# Patient Record
Sex: Female | Born: 1935 | Race: White | Hispanic: No | Smoking: Never smoker
Health system: Southern US, Community
[De-identification: ages and names within clinical notes are randomized; demographics above are authoritative.]

## PROBLEM LIST (undated history)

## (undated) DIAGNOSIS — E0590 Thyrotoxicosis, unspecified without thyrotoxic crisis or storm: Secondary | ICD-10-CM

## (undated) DIAGNOSIS — J8410 Pulmonary fibrosis, unspecified: Secondary | ICD-10-CM

## (undated) DIAGNOSIS — I4891 Unspecified atrial fibrillation: Secondary | ICD-10-CM

## (undated) DIAGNOSIS — D649 Anemia, unspecified: Secondary | ICD-10-CM

## (undated) DIAGNOSIS — E785 Hyperlipidemia, unspecified: Secondary | ICD-10-CM

## (undated) DIAGNOSIS — I10 Essential (primary) hypertension: Secondary | ICD-10-CM

## (undated) HISTORY — PX: NO PAST SURGERIES: SHX2092

---

## 2004-09-28 ENCOUNTER — Inpatient Hospital Stay: Payer: Self-pay | Admitting: Internal Medicine

## 2005-08-16 ENCOUNTER — Ambulatory Visit: Payer: Self-pay | Admitting: Internal Medicine

## 2010-12-06 ENCOUNTER — Inpatient Hospital Stay: Payer: Self-pay | Admitting: Internal Medicine

## 2011-04-25 ENCOUNTER — Inpatient Hospital Stay: Payer: Self-pay | Admitting: Internal Medicine

## 2012-02-13 LAB — URINALYSIS, COMPLETE
Glucose,UR: NEGATIVE mg/dL (ref 0–75)
Ketone: NEGATIVE
Nitrite: POSITIVE
WBC UR: 6 /HPF (ref 0–5)

## 2012-02-13 LAB — CBC
HCT: 40.5 % (ref 35.0–47.0)
MCH: 26.2 pg (ref 26.0–34.0)
MCHC: 31.6 g/dL — ABNORMAL LOW (ref 32.0–36.0)
MCV: 83 fL (ref 80–100)
Platelet: 226 10*3/uL (ref 150–440)
RBC: 4.88 10*6/uL (ref 3.80–5.20)
RDW: 15.5 % — ABNORMAL HIGH (ref 11.5–14.5)

## 2012-02-13 LAB — COMPREHENSIVE METABOLIC PANEL
Albumin: 3.7 g/dL (ref 3.4–5.0)
BUN: 14 mg/dL (ref 7–18)
Calcium, Total: 8.9 mg/dL (ref 8.5–10.1)
EGFR (African American): >60
Osmolality: 280 (ref 275–301)
Potassium: 4.1 mmol/L (ref 3.5–5.1)
SGPT (ALT): 18 U/L
Sodium: 140 mmol/L (ref 136–145)
Total Protein: 7.4 g/dL (ref 6.4–8.2)

## 2012-02-13 LAB — CK TOTAL AND CKMB (NOT AT ARMC)
CK, Total: 45 U/L (ref 21–215)
CK-MB: 0.8 ng/mL (ref 0.5–3.6)

## 2012-02-13 LAB — TROPONIN I: Troponin-I: <0.02 ng/mL

## 2012-02-14 ENCOUNTER — Inpatient Hospital Stay: Payer: Self-pay | Admitting: Internal Medicine

## 2012-02-14 LAB — CK TOTAL AND CKMB (NOT AT ARMC)
CK, Total: 64 U/L (ref 21–215)
CK-MB: 0.7 ng/mL (ref 0.5–3.6)

## 2012-02-14 LAB — PROTIME-INR
INR: 1.7
Prothrombin Time: 20.7 secs — ABNORMAL HIGH (ref 11.5–14.7)

## 2012-02-14 LAB — PRO B NATRIURETIC PEPTIDE: B-Type Natriuretic Peptide: 1054 pg/mL — ABNORMAL HIGH (ref 0–450)

## 2012-02-16 LAB — URINE CULTURE

## 2012-02-21 ENCOUNTER — Ambulatory Visit: Payer: Self-pay | Admitting: Family Medicine

## 2012-03-04 ENCOUNTER — Ambulatory Visit: Payer: Self-pay | Admitting: Family Medicine

## 2012-03-04 LAB — CREATININE, SERUM
Creatinine: 0.75 mg/dL (ref 0.60–1.30)
EGFR (African American): >60

## 2013-10-02 ENCOUNTER — Emergency Department: Payer: Self-pay | Admitting: Emergency Medicine

## 2013-10-02 LAB — COMPREHENSIVE METABOLIC PANEL
ALBUMIN: 3.5 g/dL (ref 3.4–5.0)
ANION GAP: 8 (ref 7–16)
Alkaline Phosphatase: 94 U/L
BUN: 11 mg/dL (ref 7–18)
Bilirubin,Total: 0.6 mg/dL (ref 0.2–1.0)
CHLORIDE: 106 mmol/L (ref 98–107)
Calcium, Total: 9.4 mg/dL (ref 8.5–10.1)
Co2: 23 mmol/L (ref 21–32)
Creatinine: 0.61 mg/dL (ref 0.60–1.30)
EGFR (African American): >60
GLUCOSE: 99 mg/dL (ref 65–99)
Osmolality: 273 (ref 275–301)
Potassium: 4 mmol/L (ref 3.5–5.1)
SGOT(AST): 29 U/L (ref 15–37)
SGPT (ALT): 16 U/L (ref 12–78)
SODIUM: 137 mmol/L (ref 136–145)
Total Protein: 7.9 g/dL (ref 6.4–8.2)

## 2013-10-02 LAB — CBC WITH DIFFERENTIAL/PLATELET
BASOS ABS: 0 10*3/uL (ref 0.0–0.1)
Basophil %: 0.2 %
EOS ABS: 0 10*3/uL (ref 0.0–0.7)
Eosinophil %: 0.2 %
HCT: 43.3 % (ref 35.0–47.0)
HGB: 13.9 g/dL (ref 12.0–16.0)
LYMPHS PCT: 9 %
Lymphocyte #: 1.5 10*3/uL (ref 1.0–3.6)
MCH: 26.4 pg (ref 26.0–34.0)
MCHC: 32.2 g/dL (ref 32.0–36.0)
MCV: 82 fL (ref 80–100)
Monocyte #: 1.2 x10 3/mm — ABNORMAL HIGH (ref 0.2–0.9)
Monocyte %: 7.3 %
NEUTROS ABS: 14.1 10*3/uL — AB (ref 1.4–6.5)
Neutrophil %: 83.3 %
Platelet: 252 10*3/uL (ref 150–440)
RBC: 5.28 10*6/uL — AB (ref 3.80–5.20)
RDW: 15.2 % — ABNORMAL HIGH (ref 11.5–14.5)
WBC: 17 10*3/uL — AB (ref 3.6–11.0)

## 2013-10-02 LAB — URINALYSIS, COMPLETE
Bilirubin,UR: NEGATIVE
GLUCOSE, UR: NEGATIVE mg/dL (ref 0–75)
NITRITE: POSITIVE
Ph: 5 (ref 4.5–8.0)
Protein: 30
Specific Gravity: 1.016 (ref 1.003–1.030)
WBC UR: 6 /HPF (ref 0–5)

## 2013-10-02 LAB — LIPASE, BLOOD: Lipase: 86 U/L (ref 73–393)

## 2013-10-08 ENCOUNTER — Ambulatory Visit: Payer: Self-pay | Admitting: Surgery

## 2013-10-08 LAB — COMPREHENSIVE METABOLIC PANEL
ALK PHOS: 84 U/L
ALT: 19 U/L (ref 12–78)
ANION GAP: 11 (ref 7–16)
Albumin: 3.3 g/dL — ABNORMAL LOW (ref 3.4–5.0)
BILIRUBIN TOTAL: 0.3 mg/dL (ref 0.2–1.0)
BUN: 13 mg/dL (ref 7–18)
Calcium, Total: 9.2 mg/dL (ref 8.5–10.1)
Chloride: 104 mmol/L (ref 98–107)
Co2: 26 mmol/L (ref 21–32)
Creatinine: 0.91 mg/dL (ref 0.60–1.30)
EGFR (African American): >60
GFR CALC NON AF AMER: 56 — AB
GLUCOSE: 103 mg/dL — AB (ref 65–99)
Osmolality: 282 (ref 275–301)
Potassium: 4.1 mmol/L (ref 3.5–5.1)
SGOT(AST): 16 U/L (ref 15–37)
SODIUM: 141 mmol/L (ref 136–145)
Total Protein: 7.3 g/dL (ref 6.4–8.2)

## 2013-10-08 LAB — CBC WITH DIFFERENTIAL/PLATELET
BASOS ABS: 0.1 10*3/uL (ref 0.0–0.1)
Basophil %: 0.8 %
EOS ABS: 0.1 10*3/uL (ref 0.0–0.7)
Eosinophil %: 1.5 %
HCT: 41 % (ref 35.0–47.0)
HGB: 13.1 g/dL (ref 12.0–16.0)
Lymphocyte #: 2 10*3/uL (ref 1.0–3.6)
Lymphocyte %: 19.9 %
MCH: 26.3 pg (ref 26.0–34.0)
MCHC: 32 g/dL (ref 32.0–36.0)
MCV: 82 fL (ref 80–100)
MONO ABS: 0.9 x10 3/mm (ref 0.2–0.9)
MONOS PCT: 9.6 %
Neutrophil #: 6.7 10*3/uL — ABNORMAL HIGH (ref 1.4–6.5)
Neutrophil %: 68.2 %
PLATELETS: 292 10*3/uL (ref 150–440)
RBC: 4.98 10*6/uL (ref 3.80–5.20)
RDW: 15.2 % — ABNORMAL HIGH (ref 11.5–14.5)
WBC: 9.9 10*3/uL (ref 3.6–11.0)

## 2013-10-19 LAB — COMPREHENSIVE METABOLIC PANEL
ALBUMIN: 2.9 g/dL — AB (ref 3.4–5.0)
ALK PHOS: 105 U/L
ALT: 20 U/L (ref 12–78)
Anion Gap: 4 — ABNORMAL LOW (ref 7–16)
BUN: 17 mg/dL (ref 7–18)
Bilirubin,Total: 0.6 mg/dL (ref 0.2–1.0)
Calcium, Total: 9.1 mg/dL (ref 8.5–10.1)
Chloride: 105 mmol/L (ref 98–107)
Co2: 25 mmol/L (ref 21–32)
Creatinine: 0.75 mg/dL (ref 0.60–1.30)
EGFR (Non-African Amer.): >60
Glucose: 118 mg/dL — ABNORMAL HIGH (ref 65–99)
Osmolality: 271 (ref 275–301)
POTASSIUM: 4 mmol/L (ref 3.5–5.1)
SGOT(AST): 33 U/L (ref 15–37)
SODIUM: 134 mmol/L — AB (ref 136–145)
Total Protein: 7.5 g/dL (ref 6.4–8.2)

## 2013-10-19 LAB — URINALYSIS, COMPLETE
BILIRUBIN, UR: NEGATIVE
GLUCOSE, UR: NEGATIVE mg/dL (ref 0–75)
LEUKOCYTE ESTERASE: NEGATIVE
Nitrite: NEGATIVE
PH: 5 (ref 4.5–8.0)
Protein: 30
SPECIFIC GRAVITY: 1.024 (ref 1.003–1.030)

## 2013-10-19 LAB — CBC
HCT: 41.1 % (ref 35.0–47.0)
HGB: 12.9 g/dL (ref 12.0–16.0)
MCH: 25.6 pg — AB (ref 26.0–34.0)
MCHC: 31.3 g/dL — AB (ref 32.0–36.0)
MCV: 82 fL (ref 80–100)
PLATELETS: 240 10*3/uL (ref 150–440)
RBC: 5.02 10*6/uL (ref 3.80–5.20)
RDW: 14.8 % — ABNORMAL HIGH (ref 11.5–14.5)
WBC: 17.7 10*3/uL — ABNORMAL HIGH (ref 3.6–11.0)

## 2013-10-19 LAB — PROTIME-INR
INR: 4.2 — AB
PROTHROMBIN TIME: 38.9 s — AB (ref 11.5–14.7)

## 2013-10-19 LAB — TROPONIN I

## 2013-10-20 ENCOUNTER — Inpatient Hospital Stay: Payer: Self-pay | Admitting: Internal Medicine

## 2013-10-21 LAB — BASIC METABOLIC PANEL
Anion Gap: 4 — ABNORMAL LOW (ref 7–16)
BUN: 14 mg/dL (ref 7–18)
CALCIUM: 8.7 mg/dL (ref 8.5–10.1)
CHLORIDE: 110 mmol/L — AB (ref 98–107)
Co2: 25 mmol/L (ref 21–32)
Creatinine: 0.86 mg/dL (ref 0.60–1.30)
EGFR (African American): >60
EGFR (Non-African Amer.): >60
Glucose: 96 mg/dL (ref 65–99)
Osmolality: 278 (ref 275–301)
POTASSIUM: 3.8 mmol/L (ref 3.5–5.1)
Sodium: 139 mmol/L (ref 136–145)

## 2013-10-21 LAB — CBC WITH DIFFERENTIAL/PLATELET
BASOS ABS: 0 10*3/uL (ref 0.0–0.1)
BASOS PCT: 0.3 %
Eosinophil #: 0.1 10*3/uL (ref 0.0–0.7)
Eosinophil %: 1.1 %
HCT: 34.4 % — AB (ref 35.0–47.0)
HGB: 11.5 g/dL — AB (ref 12.0–16.0)
LYMPHS PCT: 17.4 %
Lymphocyte #: 1.7 10*3/uL (ref 1.0–3.6)
MCH: 27.1 pg (ref 26.0–34.0)
MCHC: 33.4 g/dL (ref 32.0–36.0)
MCV: 81 fL (ref 80–100)
MONOS PCT: 7.8 %
Monocyte #: 0.7 x10 3/mm (ref 0.2–0.9)
NEUTROS ABS: 7 10*3/uL — AB (ref 1.4–6.5)
Neutrophil %: 73.4 %
PLATELETS: 171 10*3/uL (ref 150–440)
RBC: 4.24 10*6/uL (ref 3.80–5.20)
RDW: 14.7 % — ABNORMAL HIGH (ref 11.5–14.5)
WBC: 9.6 10*3/uL (ref 3.6–11.0)

## 2013-10-21 LAB — PROTIME-INR
INR: 4.3
Prothrombin Time: 40.1 secs — ABNORMAL HIGH (ref 11.5–14.7)

## 2013-10-22 LAB — PROTIME-INR
INR: 2.7
Prothrombin Time: 28.2 secs — ABNORMAL HIGH (ref 11.5–14.7)

## 2013-10-23 LAB — PROTIME-INR
INR: 3.3
Prothrombin Time: 32.5 secs — ABNORMAL HIGH (ref 11.5–14.7)

## 2013-10-25 LAB — CULTURE, BLOOD (SINGLE)

## 2015-01-02 NOTE — Discharge Summary (Signed)
PATIENT NAME:  Jillian Russell, Jillian Russell MR#:  [id number] DATE OF BIRTH:  [birth year]  DATE OF ADMISSION:  10/20/2013 DATE OF DISCHARGE:  10/23/2013  DISPOSITION: To skilled nursing.  DISCHARGE DIAGNOSES: 1. Altered mental status secondary to systemic inflammatory response syndrome from  pneumonia.  2. Low back pain.  3. Chronic atrial fibrillation.  4. Hypertension.  5. Mild acute delirium resolved.   DISCHARGE MEDICATIONS: 1. Metoprolol 25 mg p.o. b.i.d.  2. Coumadin 3 mg 4 times a week and 4 mg the rest of the time. The patient takes 4 mg 3 times a week and 3 mg on alternate days.  3. Cartia XT 120 mg p.o. daily.  4. Tramadol for back pain 50 mg every 4 hours p.r.n. for pain.  5. Acetaminophen as needed for pain, that is 325 mg every 6 hours.  6. Lidoderm patch 5%  topically to affected area once a day.  7. Levaquin 500 mg every other day,  5 tablets are given.   HOSPITAL CONSULTATIONS: Orthopedic consult with Dr. Bethann PunchesMark Miller.   HOSPITAL COURSE:  1. This is an 81 year old female patient with altered mental status and SIRS, found to have pneumonia and started on antibiotics. Look at the history and physical for full details. The patient brought in because of generalized weakness and poor p.o. intake for two weeks. The patient was tried on Keflex and antibiotics for UTI and the patient was found to have pneumonia, started on Levaquin. Blood cultures have been negative. Initial white count was 17 and then dropped to 9 on the 10th. The patient's blood cultures negative. The patient improved nicely with antibiotic and mental status improved. CT head did not show any acute changes. The patient will be on Levaquin for a few more days to complete the course. She has been afebrile and not hypoxic, 94% on 2 liters.  2. Chronic back pain seen by Dr. Bethann PunchesMark Miller. The patient is on K pad and lidocaine patch and also Tramadol as needed. The patient got physical therapy and the patient referred to Dr. Hyacinth MeekerMiller as  an outpatient for compression fracture. She does have chronic back pain, but the patient is tolerating physical therapy. They recommended rehab. The patient right now is on Lidoderm patch and tramadol, and walking. Today she walked with physical therapy.  The patient is referred to rehAB>  for short-term rehab by physical therapy.  3. Chronic atrial fibrillation. She is on Coumadin and INR was 4.2 when she came. The patient's Coumadin was held because of INR and repeat INR levels came back at 2.7 on the 11th of February. Restarted on Coumadin. The patient can continue with the Coumadin as I described in instructions.   TIME SPENT ON DISCHARGE PREPARATION: More than 30 minutes and the patient will be going to rehab when the bed is available. Discussed this plan with the patient's son.   ____________________________ Katha HammingSnehalatha Tyre Beaver, MD sk:sg D: 10/23/2013 13:49:22 ET T: 10/23/2013 16:49:55 ET JOB#: [id number]  cc: Katha HammingSnehalatha Pranshu Lyster, MD, <Dictator> Katha HammingSNEHALATHA Tegan Burnside MD ELECTRONICALLY SIGNED 10/30/2013 21:56

## 2015-01-02 NOTE — H&P (Signed)
PATIENT NAME:  Jillian Russell, MONGIELLO MR#:  [id number] DATE OF BIRTH:  1924-02-22  DATE OF ADMISSION:  10/20/2013  PRIMARY CARE PHYSICIAN:  Dr. Maryjane Hurter.  REFERRING PHYSICIAN:  Dr. Cyril Loosen.  CHIEF COMPLAINT: Generalized weakness and difficulty walking.   HISTORY OF PRESENT ILLNESS: The patient is an 81 year old Caucasian female who is  brought into the ER by her son for generalized weakness. Son is concerned that she is not eating well for the past 2 weeks or so, and then lately she is just lying in the bed feeling weak and tired and not moving much. The patient was evaluated by ER physician approximately 2 weeks ago and diagnosed with urinary tract infection and gallstones. At the time, she was placed on antibiotics and she was discharged home. The patient followed with Dr. Excell Seltzer, surgeon, regarding gallstones, and she was diagnosed with compression fractures of the L-spine. Dr. Excell Seltzer has asked the patient to complete her antibiotics and follow up with her primary care physician regarding the compression fractures. Primary care physician has referred her to [facility] and they have scheduled an appointment on February 18th.  The patient was given pain medication of Ultram to help with her abdominal pain and back pain. According to the son, though she has not taken any pain medication, she has been lethargic and not eating much for the past 4 or 5 days. She has been sleeping all the time, which is unusual. The patient is brought into the ER. Her CAT scan of the head and has revealed no acute findings. CT of the chest has revealed right upper lobe and middle lobe pneumonia. The patient was given IV antibiotics, and hospitalist team is called to admit the patient. During my examination, the patient is lethargic but answering questions. She said her pain is manageable. Denies any cough or chest pain. No other complaints. The patient is on Coumadin for chronic atrial fibrillation.   PAST MEDICAL  HISTORY: Acute right postcentral gyrus CVA in 2012, chronic atrial fibrillation, on Coumadin; hypertension, multinodular goiter, interstitial pulmonary fibrosis, iron deficiency anemia, cataracts.   PAST SURGICAL HISTORY: None.   ALLERGIES: No known drug allergies.   PSYCHOSOCIAL HISTORY: Lives at home with son. No history of smoking, alcohol or illicit drug usage.   FAMILY HISTORY: Father had leukemia.   HOME MEDICATIONS: Coumadin 4 mg 3 times a week and 3 mg on alternate days, 4 times in a week; metoprolol 20 mg p.o. 2 times a day, Keflex 500 mg 3 times a day, Cartia 120 mg extended release 1 capsule p.o. once daily.   REVIEW OF SYSTEMS: CONSTITUTIONAL: Denies any pain during my examination. Complaining of fever, fatigue, weakness, weight loss of 10 pounds in the past 1 month.  EYES: Denies blurry vision, double vision.  ENT: Denies epistaxis, discharge.  RESPIRATORY: Denies wheezing, hemoptysis. No COPD.  CARDIOVASCULAR: No chest pain, palpitations.   GASTROINTESTINAL: Denies nausea, vomiting, diarrhea. Not complaining of any abdominal pain.  GENITOURINARY: No dysuria or hematuria.  GYNECOLOGIC AND BREASTS: Denies breast mass or vaginal discharge.  ENDOCRINE: Denies polyuria or nocturia. Has multinodular goiter.  HEMATOLOGIC AND LYMPHATIC: No anemia, easy bruising or bleeding.  INTEGUMENTARY: No acne, rash or lesions.  MUSCULOSKELETAL: Complaining of low back pain. Denies gout.  NEUROLOGIC: No vertigo or ataxia.  PSYCHIATRIC: No ADD or OCD.   PHYSICAL EXAMINATION: VITAL SIGNS: Temperature 97.8, pulse 107, respirations 20. Blood pressure is 153/74. Pulse ox 96%.  GENERAL APPEARANCE: Not in any acute distress. The patient is lethargic,  looks pale. HEENT: Normocephalic, atraumatic. Pupils are equal, reacting light and accommodation. No scleral icterus. No conjunctival injection. No sinus tenderness. Positive postnasal drip.  NECK: Supple. No JVD. No thyromegaly. Range of motion is  intact.  LUNGS: Breath sounds on the right side was with positive crackles. Moderate air entry.  CARDIOVASCULAR: Irregularly irregular.  GASTROINTESTINAL: Soft. Bowel sounds are positive in all 4 quadrants. Nontender, nondistended. No hepatosplenomegaly. No masses felt. No CVA tenderness.  NEUROLOGIC:  Lethargic but answers questions appropriately, oriented x 3. Cranial nerves II through XII are intact. Following verbal commands. Motor and sensory are intact. Reflexes are 2+.  EXTREMITIES: No edema, cyanosis. No clubbing.  SKIN: Warm to touch. Normal turgor. No rashes. No lesions.  MUSCULOSKELETAL: No joint effusion, tenderness, erythema.  PSYCHIATRIC: Flat mood and affect.   LABORATORY DATA AND IMAGING STUDIES: CT of the head without contrast has revealed atrophy and chronic ischemic white matter disease without acute intracranial abnormality. CT of the chest without contrast: Right upper and right lower lobe pneumonia. LFTs:  Albumin is low at 2.9. The rest of the LFTs are normal. Troponin less than 0.02. WBC 17.7, hemoglobin 12.9, hematocrit 41.1, platelets 240, MCV 82. PT 38.9, INR 4.2. Urinalysis: Yellow in color, hazy in appearance. Glucose and bilirubin are negative.  Nitrites and leukocyte are negative. Chem-8: Glucose 118, BUN 17, creatinine 0.75, sodium 134, potassium 4.0. GFR greater than 60. Anion gap 4. Serum osmolality and calcium are normal.   ASSESSMENT AND PLAN: An 81 year old female presenting with generalized weakness, decreased p.o. intake. Will be admitted with the following assessment and plan:  1.  Altered mental status secondary to systemic inflammatory response syndrome from right-sided pneumonia. Blood cultures and sputum cultures are ordered. IV levofloxacin. As the patient recently used antibiotics for urinary tract infection, we will add Zosyn.  2.  Failure to thrive with 10 pounds of weight loss in 1 month, probably from ongoing series of sickness. We will put a dietitian  consult. We will get calorie count.  CT head and CT chest are normal. Further management by the rounding physician and outpatient primary care physician.   3.  Chronic atrial fibrillation on Coumadin, currently with coagulopathy. We will hold off on the Coumadin and check PT/INR. Pharmacy is consulted regarding Coumadin management.  4.  Hypertension. Blood pressure is stable. We will continue her home medication.  5.  History of stroke in the past.  6.  We will provide gastrointestinal and deep vein thrombosis prophylaxis.   She is FULL CODE. Son is the medical power of attorney. Diagnosis and plan of care was discussed in detail with the patient and her son at bedside. They verbalized understanding of the plan.   Total time spent on admission is 45 minutes.   ____________________________ Ramonita LabAruna Jajaira Ruis, MD ag:dmm D: 10/20/2013 01:04:23 ET T: 10/20/2013 09:34:37 ET JOB#: [id number]  cc: Ramonita LabAruna Caroleen Stoermer, MD, <Dictator> Marina Goodellale E. Feldpausch, MD Ramonita LabARUNA Butler Vegh MD ELECTRONICALLY SIGNED 11/02/2013 3:21

## 2015-01-02 NOTE — Consult Note (Signed)
Chief Complaint:  Subjective/Chief Complaint Low back pain is some better.  X-rays of lumbosacral spine show chronic compression fractures L2 and L4.  Present on X-rays in January also when pain was thoracic. osteoarthritis present.  straight leg raise negative.  circulation/sensation/motor function good.   VITAL SIGNS/ANCILLARY NOTES: **Vital Signs.:   11-Feb-15 05:23  Vital Signs Type Routine  Temperature Temperature (F) 98.4  Celsius 36.8  Temperature Source oral  Pulse Pulse 92  Respirations Respirations 18  Systolic BP Systolic BP 108  Diastolic BP (mmHg) Diastolic BP (mmHg) 73  Mean BP 84  Pulse Ox % Pulse Ox % 95  Pulse Ox Activity Level  At rest  Oxygen Delivery 2L    08:04  Pulse Ox % Pulse Ox % 95  Pulse Ox Activity Level  At rest  Oxygen Delivery 2L; Nasal Cannula   Brief Assessment:  EXTR negative edema   Assessment/Plan:  Assessment/Plan:  Assessment Mechanical low back pain from osteoarthritis and old compression fractures   Plan lumbosacral corset mild analgesics as needed Physical Therapy for ambulation   Electronic Signatures: Valinda HoarMiller, Abbrielle Batts E (MD)  (Signed 11-Feb-15 13:05)  Authored: Chief Complaint, VITAL SIGNS/ANCILLARY NOTES, Brief Assessment, Assessment/Plan   Last Updated: 11-Feb-15 13:05 by Valinda HoarMiller, Berkleigh Beckles E (MD)

## 2015-01-02 NOTE — Consult Note (Signed)
Brief Consult Note: Diagnosis: Low back pain.   Patient was seen by consultant.   Recommend further assessment or treatment.   Orders entered.   Comments: 81 year old female admitted with pneumonia and complaints of low back pain.  Had chest x-ray 1/22 which showed possible T-9 compression fracture. Seen by Dr Noreene FilbertFeldspaugh and [facility] follow[facility] but she has been admitted to [facility] now.  No complaints of thoracic pain.  Points to lower back as area of discomfort. Son present for exam.   Exam:  Alert, lying on side in bed.  Thoracic spine non tender.  Lumbar region sore to percussion. No bruising or swelling.  Hip range of motion good. circulation/sensation/motor function good distally.    X-rays: ordered lumbosacral spine X-rays   Rx:  kpad        Mild analgesics as needed        Will follow-up tomorrow.  Electronic Signatures: Valinda HoarMiller, Keidrick Murty E (MD)  (Signed 09-Feb-15 15:46)  Authored: Brief Consult Note   Last Updated: 09-Feb-15 15:46 by Valinda HoarMiller, Jessice Madill E (MD)

## 2015-01-03 NOTE — H&P (Signed)
PATIENT NAME:  Jillian Russell, Jillian Russell MR#:  [id number] DATE OF BIRTH:  27-Oct-1923  DATE OF ADMISSION:  02/14/2012  REFERRING PHYSICIAN: Dr. Manson PasseyBrown.   PRIMARY PHYSICIAN: Maudie Flakesale Feldpausch, MD [facility]    PRIMARY CARDIOLOGIST: Dr. Gwen PoundsKowalski   PRESENTING COMPLAINT: Pain underneath left breast.   HISTORY OF PRESENT ILLNESS: Jillian Russell is a pleasant 81 year old woman with history of chronic atrial fibrillation, history of CVA, hypertension, multinodular goiter, interstitial pulmonary fibrosis, and iron deficiency anemia who presents with reports of developing acute onset of pain underneath her left breast 1 to 2 days ago. Reports that she waited to be seen by Dr. Gwen PoundsKowalski but her symptoms worsened and decided to come in for evaluation. Describes the pain as sharp, no radiation. She reports shortness of breath with the pain. It is intermittent at best but mainly when she moves but reports no pain with laying still. Denies any nausea or vomiting. No cough or fevers. No sick contact. No diaphoresis. Denies any lower extremity swelling. Currently her pain has improved.   PAST MEDICAL HISTORY:  1. Admitted 08/14 to 04/26/2011 with acute right post central gyrus CVA.  2. Chronic atrial fibrillation, on Coumadin.  3. Hypertension.  4. Multinodular goiter.  5. Interstitial pulmonary fibrosis.  6. Iron deficiency anemia.  7. Cataracts.   PAST SURGICAL HISTORY: None.   ALLERGIES: No known drug allergies.     MEDICATIONS:  1. Coumadin 4 mg on Monday, Wednesdays, and Fridays; 3 mg on Tuesdays, Thursdays, and Sundays.  2. Metoprolol 25 mg b.i.d.  3. Cardizem CD 240 mg daily.   SOCIAL HISTORY: No tobacco, alcohol, or drug use. She lives with her son.   FAMILY HISTORY: Father had leukemia.   REVIEW OF SYSTEMS: CONSTITUTIONAL: No fevers, chills. EYES: She has history of cataracts. ENT: No epistaxis or discharge. RESPIRATORY: As per history of present illness. CARDIOVASCULAR: As per history of  present illness. Denies any edema, palpitations, or syncope. GI: No nausea or vomiting. She reports intermittent diarrhea and constipation. Denies any abdominal pain. No hematemesis or melena. GU: No dysuria or hematuria. ENDOCRINE: No polyuria or polydipsia. HEME: No bleeding. SKIN: No ulcers. MUSCULOSKELETAL: No pain or swelling. NEUROLOGIC: She has history of stroke. PSYCH: Denies any suicidal ideation.   PHYSICAL EXAMINATION:   VITAL SIGNS: Temperature 97, pulse 72, respiratory rate 18, blood pressure 136/77, sating currently mid 90's on 2 liters of oxygen. She had transient drop of her oxygen to 88% on room air per ED physician.   GENERAL: Lying in bed in no apparent distress.   HEENT: Normocephalic, atraumatic. Pupils equal, symmetric, nonicteric. Nasal cannula in place. Moist mucous membrane.   NECK: Soft and supple. No adenopathy. She has slightly elevated JVP.  CARDIOVASCULAR: Irregular, non-tachy. Soft systolic murmur. No rubs or gallops.   LUNGS: Faint expiratory wheezing. Basilar crackles. No use of accessory muscles or increased respiratory effort.   ABDOMEN: Soft. Positive bowel sounds. No mass appreciated.   EXTREMITIES: No edema. Dorsal pedis pulses intact.   MUSCULOSKELETAL: No joint effusion.   SKIN: No ulcers.   NEUROLOGIC: No dysarthria or aphasia. Symmetrical strength. No focal deficits.   PSYCH: She is alert and oriented. The patient is cooperative.   PERTINENT LABS AND STUDIES: Lactic acid 0.6. CK 45. MB 0.8. Glucose 104, BUN 14, creatinine 0.68, sodium 140, potassium 4.1, chloride 107, carbon dioxide 23, calcium 8.9. LFTs within normal limits. WBC 11.9, hemoglobin 12.8, hematocrit 40.5, platelets 226, MCV 83. Lipase 88. Troponin less than 0.02. Urinalysis  with specific gravity of 1.017, blood 1+, pH 5.0, protein negative, nitrite positive, leukocyte esterase 1+, RBCs 2 per high-power field, WBCs 6 per high-power field.   EKG with atrial fibrillation, rate of 77.  No ST elevation or depression.   CT of abdomen and pelvis with interlobular septal thickening in the lung bases with interstitial infiltrates. Appearance is suggestive of pulmonary edema. There is low attenuation lesion in the central liver. This lesion is indeterminate. CT follow-up, MRI, or PET CT evaluation could be performed if indicated. There is a small umbilical hernia containing fat. Multiple calcified and noncalcified uterine fibroids. There are multiple peripelvic cysts in each kidney. There is a small duodenal diverticulum and diverticulosis of the sigmoid colon without evidence of diverticulitis.   ASSESSMENT AND PLAN: Jillian Russell is an 81 year old woman with history of chronic atrial fibrillation on Coumadin, CVA, multinodular goiter, hypertension, interstitial fibrosis, and anemia presenting with reports of pain under left breast.  1. Atypical chest pain, CHF versus infection. Had a transient drop in oxygen. CT scan results as dictated above. Could be in the setting of pulmonary edema. She has valvular heart disease and dilated ventricles as per her last echo. Will continue empiric treatment on Levaquin. Continue on oxygen. Her WBC could be related to stress margination and wheezing is likely more cardiac. Will continue tele. Cycle cardiac enzymes. Will get a repeat echocardiogram. Her last echocardiogram from 11/12/2010 with EF of 55%, left atrium severely dilated, right atrium moderately dilated, and moderate to severe MR and TR. Will send a BNP. Start her on IV Lasix. Will continue I's and O's and daily weights. Will obtain chest x-ray post diuresis.  2. Chronic atrial fibrillation and hypertension, rate controlled. Resume her metoprolol and Cardizem. Resume her Coumadin and follow INR especially while on Levaquin.  3. Mild urinary tract infection. Send urine culture. Levaquin as above.  4. History of multinodular goiter. Send TSH. 5. Incidental finding of central lesion on CT scan.  Follow-up scan per PCP.  6. Prophylaxis with Coumadin.   TIME SPENT ON PATIENT CARE: Approximately 50 minutes.    ____________________________ Reuel Derby, MD ap:drc D: 02/14/2012 03:37:46 ET T: 02/14/2012 07:57:33 ET JOB#: [id number]  cc: Reuel Derby, MD, <Dictator> Marina Goodell, MD Reuel Derby MD ELECTRONICALLY SIGNED 02/20/2012 3:23

## 2015-01-03 NOTE — Discharge Summary (Signed)
PATIENT NAME:  Jillian Russell, [name] MR#:  [id number] DATE OF BIRTH:  11-Jul-1924  DATE OF ADMISSION:  02/14/2012 DATE OF DISCHARGE:  02/14/2012  PRESENTING COMPLAINT: Left lower chest wall pain.   DISCHARGE DIAGNOSES:  1. Chest pain, appears atypical.  2. Mild diastolic heart failure.  3. Hypertension.   CONDITION ON DISCHARGE: Fair. Sats 92% on room air.   MEDICATIONS:  1. Metoprolol 25 mg b.i.d.  2. Cardizem CD 240 mg extended release b.i.d.  3. Warfarin 3 mg 4 times a week and 4 mg 3 times a week according to previous schedule.  FOLLOWUP: Follow up with Dr. Gwen PoundsKowalski today at 3:15 p.m.   LABORATORY, DIAGNOSTIC AND RADIOLOGICAL DATA: Cardiac enzymes times three negative. PT-INR 20.7-1.7. Echo Doppler showed ejection fraction of 65%, moderate left ventricular concentric hypertrophy. Left atrium is mildly dilated. Severe mitral regurgitation, moderate tricuspid regurgitation. CT of the abdomen and pelvis with contrast showed sigmoid diverticulosis without evidence of diverticulitis. There is a moderate amount of stool in the ascending and transverse portion of the colon. Uterus is enlarged and contains multiple fibroids. There is a nonspecific 1.5-cm hypodensity in the right hepatic lobe which can be followed by triphasic CT by primary care physician as outpatient. Lactic acid 0.6. Comprehensive metabolic panel within normal limits. CBC within normal limits. Urinalysis: Leukocyte esterase positive, 6  WBCs.  B-type natriuretic peptide 1056.   BRIEF SUMMARY OF HOSPITAL COURSE: Jillian Russell is an 81 year old Caucasian female with history of atrial fibrillation on Coumadin, cerebrovascular accident, multinodular goiter, and interstitial fibrosis who presented with:  1. Atypical chest pain, which is more likely due to with mild congestive heart failure, suspected diastolic. The patient sats were transiently down to 88 in the ER, now improved to 92% to 93% on room air. She received Lasix two doses, diuresed  well, and wheezing resolved. Her echo shows ejection fraction of 65% with biatrial enlargement and moderate to severe TR and MR. She feels at baseline.  2. Chronic atrial fibrillation, on metoprolol, Cardizem, and Coumadin.  3. Hypertension, on metoprolol and Cardizem.  4. History of multinodular goiter. TSH stable.  5. Incidental finding of central liver lesion. Follow-up scans per primary care physician.  6. Hospital stay otherwise remained stable.   The discharge plan was discussed with the patient's son. The patient will follow up with Dr. Gwen PoundsKowalski today for her scheduled appointment.   TIME SPENT: 40 minutes.     ____________________________ Wylie HailSona A. Allena KatzPatel, MD sap:bjt D: 02/14/2012 14:42:57 ET T: 02/15/2012 09:43:35 ET JOB#: [id number]  cc: Philemon Riedesel A. Allena KatzPatel, MD, <Dictator> Lamar BlinksBruce J. Kowalski, MD Willow OraSONA A Saint Hank MD ELECTRONICALLY SIGNED 02/17/2012 14:13

## 2015-05-17 ENCOUNTER — Emergency Department: Payer: Medicare Other

## 2015-05-17 ENCOUNTER — Encounter: Payer: Self-pay | Admitting: Emergency Medicine

## 2015-05-17 ENCOUNTER — Emergency Department
Admission: EM | Admit: 2015-05-17 | Discharge: 2015-05-18 | Disposition: A | Discharge status: Home / Self Care | Payer: Medicare Other | Attending: Emergency Medicine | Admitting: Emergency Medicine

## 2015-05-17 DIAGNOSIS — R1030 Lower abdominal pain, unspecified: Secondary | ICD-10-CM | POA: Diagnosis present

## 2015-05-17 DIAGNOSIS — Z79899 Other long term (current) drug therapy: Secondary | ICD-10-CM | POA: Insufficient documentation

## 2015-05-17 DIAGNOSIS — Z7901 Long term (current) use of anticoagulants: Secondary | ICD-10-CM | POA: Diagnosis not present

## 2015-05-17 DIAGNOSIS — I499 Cardiac arrhythmia, unspecified: Secondary | ICD-10-CM | POA: Diagnosis not present

## 2015-05-17 DIAGNOSIS — N390 Urinary tract infection, site not specified: Secondary | ICD-10-CM | POA: Diagnosis not present

## 2015-05-17 HISTORY — DX: Unspecified atrial fibrillation: I48.91

## 2015-05-17 LAB — CBC
HCT: 42.9 % (ref 35.0–47.0)
Hemoglobin: 14 g/dL (ref 12.0–16.0)
MCH: 26.8 pg (ref 26.0–34.0)
MCHC: 32.6 g/dL (ref 32.0–36.0)
MCV: 82.3 fL (ref 80.0–100.0)
Platelets: 200 K/uL (ref 150–440)
RBC: 5.21 MIL/uL — ABNORMAL HIGH (ref 3.80–5.20)
RDW: 15.1 % — ABNORMAL HIGH (ref 11.5–14.5)
WBC: 12.4 K/uL — ABNORMAL HIGH (ref 3.6–11.0)

## 2015-05-17 LAB — COMPREHENSIVE METABOLIC PANEL WITH GFR
ALT: 11 U/L — ABNORMAL LOW (ref 14–54)
AST: 21 U/L (ref 15–41)
Albumin: 3.7 g/dL (ref 3.5–5.0)
Alkaline Phosphatase: 103 U/L (ref 38–126)
Anion gap: 7 (ref 5–15)
BUN: 12 mg/dL (ref 6–20)
CO2: 23 mmol/L (ref 22–32)
Calcium: 9.4 mg/dL (ref 8.9–10.3)
Chloride: 109 mmol/L (ref 101–111)
Creatinine, Ser: 0.72 mg/dL (ref 0.44–1.00)
GFR calc Af Amer: >60 mL/min
GFR calc non Af Amer: >60 mL/min
Glucose, Bld: 107 mg/dL — ABNORMAL HIGH (ref 65–99)
Potassium: 3.9 mmol/L (ref 3.5–5.1)
Sodium: 139 mmol/L (ref 135–145)
Total Bilirubin: 0.6 mg/dL (ref 0.3–1.2)
Total Protein: 7.3 g/dL (ref 6.5–8.1)

## 2015-05-17 LAB — URINALYSIS COMPLETE WITH MICROSCOPIC (ARMC ONLY)
Bilirubin Urine: NEGATIVE
GLUCOSE, UA: NEGATIVE mg/dL
Ketones, ur: NEGATIVE mg/dL
Nitrite: NEGATIVE
PH: 6 (ref 5.0–8.0)
Protein, ur: 30 mg/dL — AB
Specific Gravity, Urine: 1.013 (ref 1.005–1.030)

## 2015-05-17 LAB — LIPASE, BLOOD: Lipase: 18 U/L — ABNORMAL LOW (ref 22–51)

## 2015-05-17 MED ORDER — IOHEXOL 240 MG/ML SOLN
25.0000 mL | INTRAMUSCULAR | Status: AC
  Administered 2015-05-17: 25 mL via ORAL

## 2015-05-17 MED ORDER — MORPHINE SULFATE (PF) 2 MG/ML IV SOLN
2.0000 mg | Freq: Once | INTRAVENOUS | Status: AC
  Administered 2015-05-17: 2 mg via INTRAVENOUS
  Filled 2015-05-17: qty 1

## 2015-05-17 MED ORDER — CEPHALEXIN 500 MG PO CAPS: 500.0000 mg | ORAL_CAPSULE | Freq: Four times a day (QID) | ORAL | Status: DC

## 2015-05-17 MED ORDER — SODIUM CHLORIDE 0.9 % IV BOLUS (SEPSIS)
1000.0000 mL | Freq: Once | INTRAVENOUS | Status: AC
  Administered 2015-05-17: 1000 mL via INTRAVENOUS

## 2015-05-17 MED ORDER — FOSFOMYCIN TROMETHAMINE 3 G PO PACK
3.0000 g | PACK | ORAL | Status: AC
  Administered 2015-05-17: 3 g via ORAL
  Filled 2015-05-17: qty 3

## 2015-05-17 MED ORDER — ONDANSETRON HCL 4 MG/2ML IJ SOLN
4.0000 mg | INTRAMUSCULAR | Status: AC
  Administered 2015-05-17: 4 mg via INTRAVENOUS
  Filled 2015-05-17: qty 2

## 2015-05-17 MED ORDER — IOHEXOL 300 MG/ML  SOLN
100.0000 mL | Freq: Once | INTRAMUSCULAR | Status: AC | PRN
  Administered 2015-05-17: 100 mL via INTRAVENOUS

## 2015-05-17 MED ORDER — DEXTROSE 5 % IV SOLN
1.0000 g | Freq: Once | INTRAVENOUS | Status: AC
  Administered 2015-05-17: 1 g via INTRAVENOUS
  Filled 2015-05-17: qty 10

## 2015-05-17 NOTE — ED Notes (Signed)
Assumed pt care at this time. NAD noted. RR even and nonlabored. Will continue to monitor. 

## 2015-05-17 NOTE — ED Notes (Signed)
Lab called to add on PT/INR to blood already in lab.

## 2015-05-17 NOTE — ED Notes (Signed)
Patient transported to CT via stretcher.

## 2015-05-17 NOTE — ED Notes (Signed)
Pt states she has been having right and left lower quad abd pain, states "its a terrible pain down there when she sits or stands." Denies N, V, D. Son states she hasn't been eating or drinking well for days now.

## 2015-05-17 NOTE — ED Provider Notes (Signed)
[facility] Emergency Department Provider Note REMINDER - THIS NOTE IS NOT A FINAL MEDICAL RECORD UNTIL IT IS SIGNED. UNTIL THEN, THE CONTENT BELOW MAY REFLECT INFORMATION FROM A DOCUMENTATION TEMPLATE, NOT THE ACTUAL PATIENT VISIT. ____________________________________________  Time seen: Approximately 10:03 PM  I have reviewed the triage vital signs and the nursing notes.   HISTORY  Chief Complaint Abdominal Pain    HPI Jillian Russell is a 81 y.o. female history of atrial fibrillation, presents with lower abdominal pain since Friday. Patient reports he had crampy and sharp pain in her lower abdomen but this is been steadily worsening to the point that it hurts to stand. She also does feel slightly constipated. She reports a very severe pain and swollen lower abdomen worsening throughout the day.  No fevers or chills. No nausea or vomiting. No chest pain or trouble breathing. No numbness or tingling or weakness in the feet or legs. She otherwise reports feeling well.  She is coming by her son, they're here primarily for evaluation of her lower abdominal pain. No previous surgical history.   Past Medical History  Diagnosis Date  . Atrial fibrillation     There are no active problems to display for this patient.   History reviewed. No pertinent past surgical history.  Current Outpatient Rx  Name  Route  Sig  Dispense  Refill  . CARTIA XT 120 MG 24 hr capsule   Oral   Take 1 capsule by mouth daily.      5     Dispense as written.   . warfarin (COUMADIN) 3 MG tablet   Oral   Take 1 tablet by mouth daily.      5   . cephALEXin (KEFLEX) 500 MG capsule   Oral   Take 1 capsule (500 mg total) by mouth 4 (four) times daily.   40 capsule   0     Allergies Review of patient's allergies indicates no known allergies.  No family history on file.  Social History Social History  Substance Use Topics  . Smoking status: Never Smoker   .  Smokeless tobacco: None  . Alcohol Use: No    Review of Systems Constitutional: No fever/chills Eyes: No visual changes. ENT: No sore throat. Cardiovascular: Denies chest pain. Respiratory: Denies shortness of breath. Gastrointestinal: See history of present illness  No nausea, no vomiting.  No constipation. Genitourinary: Negative for dysuria. Musculoskeletal: Negative for back pain. Skin: Negative for rash. Neurological: Negative for headaches, focal weakness or numbness.  10-point ROS otherwise negative.  ____________________________________________   PHYSICAL EXAM:  VITAL SIGNS: ED Triage Vitals  Enc Vitals Group     BP 05/17/15 1656 148/91 mmHg     Pulse Rate 05/17/15 1656 90     Resp 05/17/15 1656 18     Temp 05/17/15 1656 97.9 F (36.6 C)     Temp Source 05/17/15 1656 Oral     SpO2 05/17/15 1656 95 %     Weight 05/17/15 1656 112 lb (50.803 kg)     Height 05/17/15 1656 5\' 3"  (1.6 m)     Head Cir --      Peak Flow --      Pain Score 05/17/15 1657 0     Pain Loc --      Pain Edu? --      Excl. in GC? --    Constitutional: Alert and oriented. Well appearing and in no acute distress. Eyes: Conjunctivae are normal. PERRL. EOMI.  Head: Atraumatic. Nose: No congestion/rhinnorhea. Mouth/Throat: Mucous membranes are moist.  Oropharynx non-erythematous. Neck: No stridor.   Cardiovascular: Slightly irregular rhythm. Grossly normal heart sounds.  Good peripheral circulation. Respiratory: Normal respiratory effort.  No retractions. Lungs CTAB. Gastrointestinal: Soft and nontender except for moderate discomfort in the lower abdomen bilateral without rebound or guarding. There is minimal but soft distention of the lower abdomen. No abdominal bruits. No CVA tenderness. Patient denies difficulty urinating. No changes in urination. Musculoskeletal: No lower extremity tenderness nor edema.  No joint effusions. Neurologic:  Normal speech and language. No gross focal neurologic  deficits are appreciated. Skin:  Skin is warm, dry and intact. No rash noted. Psychiatric: Mood and affect are normal. Speech and behavior are normal.  Remarkably normal exam except for irregular heartbeat and lower abdominal tenderness given the patient's age. ____________________________________________   LABS (all labs ordered are listed, but only abnormal results are displayed)  Labs Reviewed  LIPASE, BLOOD - Abnormal; Notable for the following:    Lipase 18 (*)    All other components within normal limits  COMPREHENSIVE METABOLIC PANEL - Abnormal; Notable for the following:    Glucose, Bld 107 (*)    ALT 11 (*)    All other components within normal limits  CBC - Abnormal; Notable for the following:    WBC 12.4 (*)    RBC 5.21 (*)    RDW 15.1 (*)    All other components within normal limits  URINALYSIS COMPLETEWITH MICROSCOPIC ([facility] ONLY) - Abnormal; Notable for the following:    Color, Urine YELLOW (*)    APPearance CLOUDY (*)    Hgb urine dipstick 1+ (*)    Protein, ur 30 (*)    Leukocytes, UA 3+ (*)    Bacteria, UA RARE (*)    Squamous Epithelial / LPF 0-5 (*)    All other components within normal limits   ____________________________________________  EKG   ____________________________________________  RADIOLOGY  CT scan pending, will be followed by Dr. Manson Passey ____________________________________________   PROCEDURES  Procedure(s) performed: None  Critical Care performed: No  ____________________________________________   INITIAL IMPRESSION / ASSESSMENT AND PLAN / ED COURSE  Pertinent labs & imaging results that were available during my care of the patient were reviewed by me and considered in my medical decision making (see chart for details).  Patient presents with increasing lower abdominal pain for the last several days with associated constipation. She does have tenderness of the lower abdomen and does questionably seem slightly distended. She  denies infectious symptoms. No nausea or vomiting. No cardiac or pulmonary symptoms.  Based on the patient's age and chief complaint, I am concerned for the possibility of bowel obstruction versus other than infectious abdominal etiologies. Far less likely dissection or aortic aneurysm. Her labs show elevated white count, and I'll obtain CT imaging to further evaluate. In addition we will check a urinalysis though she complains of no acute urinary complaints.  ----------------------------------------- 11:32 PM on 05/17/2015 -----------------------------------------  Urinalysis does look mildly positive. I will give ceftriaxone, and place the patient on cephalexin. I discussed with the patient's son is the patient is currently at CT scan. Dr. Manson Passey aware, and will also follow up on CT result. Should her CT scan not showing acute abnormality, that I would anticipate discharge with treatment for possible urinary tract infection.  Ongoing care and disposition assigned to Dr. Manson Passey. ____________________________________________   FINAL CLINICAL IMPRESSION(S) / ED DIAGNOSES  Final diagnoses:  Acute urinary tract infection  Lower  abdominal pain      Sharyn Creamer, MD 05/17/15 [phone]

## 2015-05-18 DIAGNOSIS — N390 Urinary tract infection, site not specified: Secondary | ICD-10-CM | POA: Diagnosis not present

## 2015-05-18 LAB — PROTIME-INR
INR: 2.86
PROTHROMBIN TIME: 30.1 s — AB (ref 11.4–15.0)

## 2015-05-18 NOTE — Discharge Instructions (Signed)

## 2015-05-18 NOTE — ED Provider Notes (Signed)
I assumed care of the patient 11:00 PM from Dr. Judd Gaudier. Patient's CT scan of the abdomen and pelvis revealed CT Abdomen Pelvis W Contrast (Final result) Result time: 05/18/15 00:42:48   Final result by Rad Results In Interface (05/18/15 00:42:48)   Narrative:   CLINICAL DATA: Right and left lower quadrant pain starting years ago. Examination suggest small bowel obstruction.  EXAM: CT ABDOMEN AND PELVIS WITH CONTRAST  TECHNIQUE: Multidetector CT imaging of the abdomen and pelvis was performed using the standard protocol following bolus administration of intravenous contrast.  CONTRAST: OMNIPAQUE IOHEXOL 300 MG/ML SOLN  COMPARISON: 10/03/2013  FINDINGS: Visualization of lung bases is limited due to motion artifact. There is evidence of emphysema and fibrosis in the lung bases with tiny punctate nodular changes possibly representing interstitial nodules versus changes of bronchiolitis. Appearance is similar to prior study.  Low-attenuation lesions centrally in the liver measuring 12 mm diameter. No change since prior study. The gallbladder, adrenal glands, inferior vena cava, and retroperitoneal lymph nodes are unremarkable. There is diffuse fatty infiltration of the pancreas. Calcified granulomas in the spleen. Parapelvic cysts in both kidneys. No hydronephrosis or solid renal mass identified. Calcification and torsion of the aorta without aneurysm. Stomach, small bowel, and colon are not abnormally distended. Small bowel diameter at the upper limits of normal. Duodenum diverticulum. Fecal appearing material is present in the distal small bowel and there are multiple small bowel diverticula. This may indicate a stasis pattern. Stool-filled colon without abnormal distention or wall thickening. No free air or free fluid in the abdomen.  Pelvis: Multiple calcified fibroids in the uterus. No abnormal adnexal masses. Bladder wall is not thickened. Appendix is  normal. Diverticulosis of the sigmoid colon. No evidence of diverticulitis.No free or loculated pelvic fluid collections. Diffuse bone demineralization. Multiple compression fractures throughout the lumbar spine and at T12. The T12 fracture is new since the previous study. This is likely due to osteoporosis. Sacrum and pelvis appear intact.  IMPRESSION: No findings to suggest small bowel obstruction. There is fecal appearing material within the small bowel and multiple small bowel diverticula are present which may suggest stasis pattern. Diverticulosis of the sigmoid colon without diverticulitis. Calcified uterine fibroids. Parapelvic cysts in the kidneys. Diffuse bone demineralization with multiple spinal compression fractures most likely represent osteoporosis.   Electronically Signed By: Burman Nieves M.D. On: 05/18/2015 00:42         All clinical findings were discussed with the patient and her son.  Darci Current, MD 05/18/15 [id number]

## 2015-05-19 ENCOUNTER — Encounter: Payer: Self-pay | Admitting: Emergency Medicine

## 2015-05-19 ENCOUNTER — Emergency Department
Admission: EM | Admit: 2015-05-19 | Discharge: 2015-05-20 | Disposition: A | Discharge status: Home / Self Care | Payer: Medicare Other | Attending: Emergency Medicine | Admitting: Emergency Medicine

## 2015-05-19 DIAGNOSIS — Z792 Long term (current) use of antibiotics: Secondary | ICD-10-CM | POA: Insufficient documentation

## 2015-05-19 DIAGNOSIS — S32000A Wedge compression fracture of unspecified lumbar vertebra, initial encounter for closed fracture: Secondary | ICD-10-CM

## 2015-05-19 DIAGNOSIS — M8008XA Age-related osteoporosis with current pathological fracture, vertebra(e), initial encounter for fracture: Secondary | ICD-10-CM | POA: Diagnosis not present

## 2015-05-19 DIAGNOSIS — M25551 Pain in right hip: Secondary | ICD-10-CM | POA: Diagnosis present

## 2015-05-19 DIAGNOSIS — Z7901 Long term (current) use of anticoagulants: Secondary | ICD-10-CM | POA: Insufficient documentation

## 2015-05-19 DIAGNOSIS — M5431 Sciatica, right side: Secondary | ICD-10-CM

## 2015-05-19 DIAGNOSIS — R1031 Right lower quadrant pain: Secondary | ICD-10-CM | POA: Insufficient documentation

## 2015-05-19 DIAGNOSIS — F0390 Unspecified dementia without behavioral disturbance: Secondary | ICD-10-CM | POA: Insufficient documentation

## 2015-05-19 LAB — URINALYSIS COMPLETE WITH MICROSCOPIC (ARMC ONLY)
Bilirubin Urine: NEGATIVE
GLUCOSE, UA: NEGATIVE mg/dL
Leukocytes, UA: NEGATIVE
NITRITE: NEGATIVE
Protein, ur: NEGATIVE mg/dL
Specific Gravity, Urine: 1.012 (ref 1.005–1.030)
pH: 5 (ref 5.0–8.0)

## 2015-05-19 LAB — COMPREHENSIVE METABOLIC PANEL
ALBUMIN: 3.4 g/dL — AB (ref 3.5–5.0)
ALT: 11 U/L — ABNORMAL LOW (ref 14–54)
AST: 23 U/L (ref 15–41)
Alkaline Phosphatase: 93 U/L (ref 38–126)
Anion gap: 8 (ref 5–15)
BILIRUBIN TOTAL: 0.6 mg/dL (ref 0.3–1.2)
BUN: 10 mg/dL (ref 6–20)
CO2: 23 mmol/L (ref 22–32)
Calcium: 9.1 mg/dL (ref 8.9–10.3)
Chloride: 107 mmol/L (ref 101–111)
Creatinine, Ser: 0.63 mg/dL (ref 0.44–1.00)
GFR calc Af Amer: >60 mL/min (ref >60)
GFR calc non Af Amer: >60 mL/min (ref >60)
GLUCOSE: 115 mg/dL — AB (ref 65–99)
POTASSIUM: 4.3 mmol/L (ref 3.5–5.1)
Sodium: 138 mmol/L (ref 135–145)
TOTAL PROTEIN: 7.4 g/dL (ref 6.5–8.1)

## 2015-05-19 LAB — CBC
HEMATOCRIT: 43.1 % (ref 35.0–47.0)
Hemoglobin: 14 g/dL (ref 12.0–16.0)
MCH: 26.9 pg (ref 26.0–34.0)
MCHC: 32.4 g/dL (ref 32.0–36.0)
MCV: 83 fL (ref 80.0–100.0)
Platelets: 186 10*3/uL (ref 150–440)
RBC: 5.19 MIL/uL (ref 3.80–5.20)
RDW: 15.2 % — AB (ref 11.5–14.5)
WBC: 12.1 10*3/uL — ABNORMAL HIGH (ref 3.6–11.0)

## 2015-05-19 LAB — LIPASE, BLOOD: Lipase: 19 U/L — ABNORMAL LOW (ref 22–51)

## 2015-05-19 MED ORDER — ONDANSETRON HCL 4 MG/2ML IJ SOLN
4.0000 mg | Freq: Once | INTRAMUSCULAR | Status: AC
Start: 2015-05-19 — End: 2015-05-19
  Administered 2015-05-19: 4 mg via INTRAVENOUS
  Filled 2015-05-19: qty 2

## 2015-05-19 MED ORDER — FENTANYL CITRATE (PF) 100 MCG/2ML IJ SOLN
25.0000 ug | Freq: Once | INTRAMUSCULAR | Status: AC
  Administered 2015-05-19: 25 ug via INTRAVENOUS
  Filled 2015-05-19: qty 2

## 2015-05-19 MED ORDER — IBUPROFEN 400 MG PO TABS
200.0000 mg | ORAL_TABLET | Freq: Once | ORAL | Status: AC
  Administered 2015-05-19: 200 mg via ORAL
  Filled 2015-05-19: qty 1

## 2015-05-19 MED ORDER — HYDROCODONE-ACETAMINOPHEN 5-325 MG PO TABS
1.0000 | ORAL_TABLET | Freq: Once | ORAL | Status: AC
  Administered 2015-05-19: 1 via ORAL
  Filled 2015-05-19: qty 1

## 2015-05-19 NOTE — ED Provider Notes (Signed)
[facility] Emergency Department Provider Note   ____________________________________________  Time seen: 4 PM I have reviewed the triage vital signs and the triage nursing note.  HISTORY  Chief Complaint Abdominal Pain   Historian Patient is a poor historian. Additional history is per the son with whom she lives  HPI Jillian Russell is a 81 y.o. female who was seen Monday evening and discharged early Tuesday morning around 2 AM after being evaluated for abdominal pain which was more in the right lower quadrant, and a CT was negative, and the patient was discharged with Keflex for urinary tract infection. She is returning today because at home yesterday she was unable to get out of bed, unable to bear weight, in severe pain when she moves her right hip and torso, and so her son is bringing her in for reevaluation and possible placement.  There is no reported trauma recently, although she does have a bit of dementia, so the son is unsure whether or not there could've been an unwitnessed fall. She had no appetite and was not eating yesterday. No vomiting or diarrhea.She's not had fever. She's not been coughing. There is been no complaint of chest pain. The son feels like the abdominal pain is actually probably coming more from her hip or her back based on the patient's complaint when you move her leg and when she moves around. Symptoms are moderate severe.    Past Medical History  Diagnosis Date  . Atrial fibrillation     There are no active problems to display for this patient.   No past surgical history on file.  Current Outpatient Rx  Name  Route  Sig  Dispense  Refill  . cephALEXin (KEFLEX) 500 MG capsule   Oral   Take 1 capsule (500 mg total) by mouth 4 (four) times daily.   40 capsule   0   . diltiazem (CARDIZEM CD) 120 MG 24 hr capsule   Oral   Take 120 mg by mouth at bedtime.          Marland Kitchen warfarin (COUMADIN) 3 MG tablet   Oral   Take 3 mg by  mouth at bedtime.           Allergies Review of patient's allergies indicates no known allergies.  No family history on file.  Social History Social History  Substance Use Topics  . Smoking status: Never Smoker   . Smokeless tobacco: None  . Alcohol Use: No   lives at home with her son  Review of Systems  Constitutional: Negative for fever. Eyes: Negative for visual changes. ENT: Negative for sore throat. Cardiovascular: Negative for chest pain. Respiratory: Negative for shortness of breath. Gastrointestinal: Negative for vomiting and diarrhea. Genitourinary: Negative for dysuria. Musculoskeletal: Positive for known chronic back pain associated with prior compression fractures. Skin: Negative for rash. Neurological: Negative for headache. 10 point Review of Systems otherwise negative ____________________________________________   PHYSICAL EXAM:  VITAL SIGNS: ED Triage Vitals  Enc Vitals Group     BP 05/19/15 1334 140/82 mmHg     Pulse Rate 05/19/15 1334 86     Resp 05/19/15 1334 18     Temp 05/19/15 1334 98 F (36.7 C)     Temp Source 05/19/15 1334 Oral     SpO2 05/19/15 1334 94 %     Weight --      Height --      Head Cir --      Peak Flow --  Pain Score --      Pain Loc --      Pain Edu? --      Excl. in GC? --      Constitutional: Alert and cooperative. Well appearing and in no distress. Eyes: Conjunctivae are normal. PERRL. Normal extraocular movements. ENT   Head: Normocephalic and atraumatic.   Nose: No congestion/rhinnorhea.   Mouth/Throat: Mucous membranes are moist.   Neck: No stridor. Cardiovascular/Chest: Normal rate, regular rhythm.  No murmurs, rubs, or gallops. Respiratory: Normal respiratory effort without tachypnea nor retractions. Breath sounds are clear and equal bilaterally. No wheezes/rales/rhonchi. Gastrointestinal: Soft. No distention, no guarding, no rebound. Nontender    Genitourinary/rectal:Deferred Musculoskeletal: Pelvis stable, with mild tenderness to palpation over the anterior pelvis/hip margin. Exquisite/severe pain with lateral and medial movement to the right hip both passively and actively. There is no redness or warmth over the hip or skin. There is no skin rash over the head. Mild tenderness on palpation of the very lower lumbar spine, without step-off or point tenderness over the spine prominences. Neurologic:  Normal speech and language. No gross or focal neurologic deficits are appreciated. Skin:  Skin is warm, dry and intact. No rash noted.   ____________________________________________   EKG I, Governor Rooks, MD, the attending physician have personally viewed and interpreted all ECGs.  No EKG performed ____________________________________________  LABS (pertinent positives/negatives)  Lipase 19 Comprehensive metabolic panel without significant abnormalities White blood count is 12.1, hemoglobin is 14.0, platelets 186 Urinalysis : trace ketones, rare bacteria and otherwise negative  ____________________________________________  RADIOLOGY All Xrays were viewed by me. Imaging interpreted by Radiologist.  None __________________________________________  PROCEDURES  Procedure(s) performed: None  Critical Care performed: None  ____________________________________________   ED COURSE / ASSESSMENT AND PLAN  CONSULTATIONS: Placed Child psychotherapist consult, for the morning.  Pertinent labs & imaging results that were available during my care of the patient were reviewed by me and considered in my medical decision making (see chart for details).  I reviewed the patient's history, as well as her laboratory findings and CT scan from approximately one half days ago which did scan through the level of the pelvis and hips. There is no evidence of fracture of the pelvis or the hip. There were multiple old compression fractures of the lumbar  spine and a new compression fracture of the thoracic 12 level, however she's not having point tenderness over that level. On exam today she seems to be having pain mostly in the right hip with its radiating into the right side of the abdomen and around to the back. Possible this could be sciatica type pain, however slightly less common given the worse source of pain seems to be more anterior than posteriorly. I do not see any clinical symptoms that are concerning for a joint infection. She does have pretty terrible arthritis, and this could be acute arthritis pain. She has not tried any pain medications at home. I am going to give her dose of ibuprofen here, as well as a dose of fentanyl. If she is unable to stand, after attempts at pain control, she will need nursing home placement as she is unable to stand right now.  I will recheck her urinalysis in case there evidence of resistant/persisting UTI despite being on Keflex. Her white blood cell count is still minimally elevated, seems it was when she was seen a day and half ago.   ----------------------------------------- 8:39 PM on 05/19/2015 ----------------------------------------- UA negative for sign of persisting UTI. Patient  was given fentanyl and oxycodone and is still in significant amount pain and having trouble sitting up in the bed, moving side to side and certainly is unable to walk. I placed a short consult which will be completed in the morning and discuss with patient and son about nursing home/rehabilitation evaluation and transfer tomorrow.  I think her back pain is likely coming from sciatica. Patient care transferred to night ER physician Dr. Manson Passey at shift change 11 PM.  Patient / Family / Caregiver informed of clinical course, medical decision-making process, and agree with plan.    ___________________________________________   FINAL CLINICAL IMPRESSION(S) / ED DIAGNOSES   Final diagnoses:  Sciatica, right        Governor Rooks, MD 05/19/15 2041

## 2015-05-19 NOTE — ED Notes (Signed)
Pt returns today for lower abdominal pain.  Pt was seen recently for the same.  Pt has some confusion making details difficult.  Son reports that she has had poor appetite and has had one episode of diarrhea. Pt has also been unable to get out of bed.

## 2015-05-20 MED ORDER — OXYCODONE-ACETAMINOPHEN 5-325 MG PO TABS: 1.0000 | ORAL_TABLET | Freq: Four times a day (QID) | ORAL | Status: AC | PRN

## 2015-05-20 NOTE — Discharge Instructions (Signed)
You were prescribed a medication that is potentially sedating. Do not drink alcohol, drive or participate in any other potentially dangerous activities while taking this medication as it may make you sleepy. Do not take this medication with any other sedating medications, either prescription or over-the-counter. If you were prescribed Percocet or Vicodin, do not take these with acetaminophen (Tylenol) as it is already contained within these medications.   Opioid pain medications (or "narcotics") can be habit forming.  Use it as little as possible to achieve adequate pain control.  Do not use or use it with extreme caution if you have a history of opiate abuse or dependence.  If you are on a pain contract with your primary care doctor or a pain specialist, be sure to let them know you were prescribed this medication today from the [facility] Emergency Department.  This medication is intended for your use only - do not give any to anyone else and keep it in a secure place where nobody else, especially children and pets, have access to it.  It will also cause or worsen constipation, so you may want to consider taking an over-the-counter stool softener while you are taking this medication.   Back Pain, Adult Back pain is very common. The pain often gets better over time. The cause of back pain is usually not dangerous. Most people can learn to manage their back pain on their own.  HOME CARE   Stay active. Start with short walks on flat ground if you can. Try to walk farther each day.  Do not sit, drive, or stand in one place for more than 30 minutes. Do not stay in bed.  Do not avoid exercise or work. Activity can help your back heal faster.  Be careful when you bend or lift an object. Bend at your knees, keep the object close to you, and do not twist.  Sleep on a firm mattress. Lie on your side, and bend your knees. If you lie on your back, put a pillow under your knees.  Only take medicines  as told by your doctor.  Put ice on the injured area.  Put ice in a plastic bag.  Place a towel between your skin and the bag.  Leave the ice on for 15-20 minutes, 03-04 times a day for the first 2 to 3 days. After that, you can switch between ice and heat packs.  Ask your doctor about back exercises or massage.  Avoid feeling anxious or stressed. Find good ways to deal with stress, such as exercise. GET HELP RIGHT AWAY IF:   Your pain does not go away with rest or medicine.  Your pain does not go away in 1 week.  You have new problems.  You do not feel well.  The pain spreads into your legs.  You cannot control when you poop (bowel movement) or pee (urinate).  Your arms or legs feel weak or lose feeling (numbness).  You feel sick to your stomach (nauseous) or throw up (vomit).  You have belly (abdominal) pain.  You feel like you may pass out (faint). MAKE SURE YOU:   Understand these instructions.  Will watch your condition.  Will get help right away if you are not doing well or get worse. Document Released: 02/14/2008 Document Revised: 11/20/2011 Document Reviewed: 12/30/2013 [facility] Patient Information 2015 [place], [place]. This information is not intended to replace advice given to you by your health care provider. Make sure you discuss any questions you  have with your health care provider. ° °

## 2015-05-20 NOTE — ED Provider Notes (Signed)
-----------------------------------------   12:50 PM on 05/20/2015 -----------------------------------------  Patient remains medically stable. She is been arranged for discharge to skilled nursing facility at for management of her functional limitation due to back pain. I have written a prescription for Percocet for pain control. She should follow up with orthopedics and primary care within 1 week.    Medication List    TAKE these medications        oxyCODONE-acetaminophen 5-325 MG per tablet  Commonly known as:  ROXICET  Take 1 tablet by mouth every 6 (six) hours as needed for severe pain.      ASK your doctor about these medications        cephALEXin 500 MG capsule  Commonly known as:  KEFLEX  Take 1 capsule (500 mg total) by mouth 4 (four) times daily.     diltiazem 120 MG 24 hr capsule  Commonly known as:  CARDIZEM CD  Take 120 mg by mouth at bedtime.     warfarin 3 MG tablet  Commonly known as:  COUMADIN  Take 3 mg by mouth at bedtime.       The patient should continue her previously prescribed Keflex, diltiazem, and warfarin as directed.   Final diagnoses:  Sciatica, right  Lumbar compression fracture, closed, initial encounter     Sharman Cheek, MD 05/20/15 1252

## 2015-05-20 NOTE — Clinical Social Work Note (Signed)
Clinical Social Work Assessment  Patient Details  Name: Jillian Russell MRN: [id number] Date of Birth: [birth year]  Date of referral:  05/20/15               Reason for consult:  Facility Placement                Permission sought to share information with:  Facility Jillian Russell, Family Supports Permission granted to share information::  Yes, Verbal Permission Granted  Name::        Agency::  [facility]  Relationship::  son   Contact Information:     Housing/Transportation Living arrangements for the past 2 months:  Single Family Home Source of Information:  Adult Children Patient Interpreter Needed:  None Criminal Activity/Legal Involvement Pertinent to Current Situation/Hospitalization:  No - Comment as needed Significant Relationships:  Adult Children, Neighbor Lives with:  Adult Children Do you feel safe going back to the place where you live?  Yes Need for family participation in patient care:  Yes (Comment)  Care giving concerns:  Patient lives with adult son who works all day. She has a network of neighbours who check on her   Facilities manager / plan:  Jillian Russell's met with patient and son. Patients son was informed of health care options avaialable to support his mother. Patients son felt it would be best if she  Received care  At [facility] care facility. Jillian Russell ( Patient son requested semi private and will pay for out of pocket for extra physical therapy. Jillian Russell contacted [facility] and they had abed and son did accept. Jillian Russell completed FL2 and  Informed  EDP and nursing staff of pt transfer. Son will transport his mother to [place]. Jillian Russell completed Transfer bundle for patient to bring to [facility]  Employment status:  Retired Forensic scientist:  Medicare PT Recommendations:  Not assessed at this time Information / Referral to community resources:  [place]  Patient/Family's Response to care:    Patient/Family's Understanding of and  Emotional Response to Diagnosis, Current Treatment, and Prognosis:    Emotional Assessment Appearance:  Well-Groomed, Appears stated age Attitude/Demeanor/Rapport:   (Engaged and pleasant) Affect (typically observed):  Accepting, Adaptable Orientation:  Oriented to Self, Oriented to Place, Oriented to  Time, Oriented to Situation Alcohol / Substance use:  Never Used Psych involvement (Current and /or in the community):  No (Comment)  Discharge Needs  Concerns to be addressed:  Discharge Planning Concerns Readmission within the last 30 days:  No Current discharge risk:  Dependent with Mobility Barriers to Discharge:  Barriers Resolved   Jillian Reamer, Jillian Russell 05/20/2015, 12:17 PM

## 2015-06-15 ENCOUNTER — Other Ambulatory Visit: Payer: Self-pay | Admitting: Orthopedic Surgery

## 2015-06-15 ENCOUNTER — Ambulatory Visit
Admission: RE | Admit: 2015-06-15 | Discharge: 2015-06-15 | Disposition: A | Discharge status: Home / Self Care | Payer: Medicare Other | Source: Ambulatory Visit | Attending: Orthopedic Surgery | Admitting: Orthopedic Surgery

## 2015-06-15 DIAGNOSIS — M5440 Lumbago with sciatica, unspecified side: Secondary | ICD-10-CM

## 2015-06-15 DIAGNOSIS — S32040A Wedge compression fracture of fourth lumbar vertebra, initial encounter for closed fracture: Secondary | ICD-10-CM

## 2015-06-17 ENCOUNTER — Other Ambulatory Visit: Payer: Self-pay | Admitting: Orthopedic Surgery

## 2015-06-17 DIAGNOSIS — S32000A Wedge compression fracture of unspecified lumbar vertebra, initial encounter for closed fracture: Secondary | ICD-10-CM

## 2015-06-23 ENCOUNTER — Telehealth: Payer: Self-pay | Admitting: Orthopedic Surgery

## 2015-06-23 ENCOUNTER — Encounter
Admission: RE | Admit: 2015-06-23 | Discharge: 2015-06-23 | Disposition: A | Discharge status: Home / Self Care | Payer: Medicare Other | Source: Ambulatory Visit | Attending: Orthopedic Surgery | Admitting: Orthopedic Surgery

## 2015-06-23 DIAGNOSIS — S32000A Wedge compression fracture of unspecified lumbar vertebra, initial encounter for closed fracture: Secondary | ICD-10-CM | POA: Diagnosis present

## 2015-06-23 DIAGNOSIS — X58XXXA Exposure to other specified factors, initial encounter: Secondary | ICD-10-CM | POA: Insufficient documentation

## 2015-06-23 MED ORDER — TECHNETIUM TC 99M MEDRONATE IV KIT
25.0000 | PACK | Freq: Once | INTRAVENOUS | Status: AC | PRN
  Administered 2015-06-23: 21.78 via INTRAVENOUS

## 2015-06-28 NOTE — Patient Instructions (Addendum)
  Your procedure is scheduled on: 06-29-15 Report to [facility] ([id number]) @ 1:30 PM  Remember: Instructions that are not followed completely may result in serious medical risk, up to and including death, or upon the discretion of your surgeon and anesthesiologist your surgery may need to be rescheduled.    _X___ 1. Do not eat food or drink liquids after midnight. No gum chewing or hard candies.     ____ 2. No Alcohol for 24 hours before or after surgery.   ____ 3. Bring all medications with you on the day of surgery if instructed.    _X___ 4. Notify your doctor if there is any change in your medical condition     (cold, fever, infections).     Do not wear jewelry, make-up, hairpins, clips or nail polish.  Do not wear lotions, powders, or perfumes. You may wear deodorant.  Do not shave 48 hours prior to surgery. Men may shave face and neck.  Do not bring valuables to the hospital.    [facility] is not [facility] for any belongings or valuables.               Contacts, dentures or bridgework may not be worn into surgery.  Leave your suitcase in the car. After surgery it may be brought to your room.  For patients admitted to the hospital, discharge time is determined by your  treatment team.   Patients discharged the day of surgery will not be allowed to drive home.   Please read over the following fact sheets that you were given:     ____ Take these medicines the morning of surgery with A SIP OF WATER:    1. PT MAY TAKE A PERCOCET THE MORNING OF SURGERY WITH A SMALL SIP OF WATER-IF RESIDENT HAS TO TAKE IN APPLESAUCE DO NOT GIVE  2.   3.   4.  5.  6.  ____ Fleet Enema (as directed)   ____ Use CHG Soap as directed  ____ Use inhalers on the day of surgery  ____ Stop metformin 2 days prior to surgery    ____ Take 1/2 of usual insulin dose the night before surgery and none on the morning of surgery.   ____ Stop Coumadin/Plavix/aspirin-COUMADIN  HAS ALREADY BEEN STOPPED(06-24-15)  ____ Stop Anti-inflammatories   ____ Stop supplements until after surgery.    ____ Bring C-Pap to the hospital.

## 2015-06-28 NOTE — Telephone Encounter (Signed)
done

## 2015-06-29 ENCOUNTER — Encounter: Payer: Self-pay | Admitting: Anesthesiology

## 2015-06-29 ENCOUNTER — Other Ambulatory Visit: Payer: Medicare Other

## 2015-06-29 ENCOUNTER — Ambulatory Visit
Admission: RE | Admit: 2015-06-29 | Discharge: 2015-06-29 | Disposition: A | Discharge status: Skilled Nursing Facility | Payer: Medicare Other | Source: Ambulatory Visit | Attending: Orthopedic Surgery | Admitting: Orthopedic Surgery

## 2015-06-29 ENCOUNTER — Encounter: Payer: Self-pay | Admitting: *Deleted

## 2015-06-29 ENCOUNTER — Encounter
Admission: RE | Disposition: A | Discharge status: Skilled Nursing Facility | Payer: Self-pay | Source: Ambulatory Visit | Attending: Orthopedic Surgery

## 2015-06-29 DIAGNOSIS — Z8262 Family history of osteoporosis: Secondary | ICD-10-CM | POA: Diagnosis not present

## 2015-06-29 DIAGNOSIS — I4891 Unspecified atrial fibrillation: Secondary | ICD-10-CM | POA: Diagnosis not present

## 2015-06-29 DIAGNOSIS — Z79899 Other long term (current) drug therapy: Secondary | ICD-10-CM | POA: Insufficient documentation

## 2015-06-29 DIAGNOSIS — E0590 Thyrotoxicosis, unspecified without thyrotoxic crisis or storm: Secondary | ICD-10-CM | POA: Insufficient documentation

## 2015-06-29 DIAGNOSIS — Y939 Activity, unspecified: Secondary | ICD-10-CM | POA: Insufficient documentation

## 2015-06-29 DIAGNOSIS — S32019A Unspecified fracture of first lumbar vertebra, initial encounter for closed fracture: Secondary | ICD-10-CM | POA: Diagnosis present

## 2015-06-29 DIAGNOSIS — Y92008 Other place in unspecified non-institutional (private) residence as the place of occurrence of the external cause: Secondary | ICD-10-CM | POA: Diagnosis not present

## 2015-06-29 DIAGNOSIS — Z806 Family history of leukemia: Secondary | ICD-10-CM | POA: Insufficient documentation

## 2015-06-29 DIAGNOSIS — D509 Iron deficiency anemia, unspecified: Secondary | ICD-10-CM | POA: Diagnosis not present

## 2015-06-29 DIAGNOSIS — Z7901 Long term (current) use of anticoagulants: Secondary | ICD-10-CM | POA: Diagnosis not present

## 2015-06-29 DIAGNOSIS — I517 Cardiomegaly: Secondary | ICD-10-CM | POA: Diagnosis not present

## 2015-06-29 DIAGNOSIS — Z419 Encounter for procedure for purposes other than remedying health state, unspecified: Secondary | ICD-10-CM

## 2015-06-29 DIAGNOSIS — W19XXXA Unspecified fall, initial encounter: Secondary | ICD-10-CM | POA: Diagnosis not present

## 2015-06-29 DIAGNOSIS — Z539 Procedure and treatment not carried out, unspecified reason: Secondary | ICD-10-CM | POA: Insufficient documentation

## 2015-06-29 DIAGNOSIS — I10 Essential (primary) hypertension: Secondary | ICD-10-CM | POA: Diagnosis not present

## 2015-06-29 DIAGNOSIS — E785 Hyperlipidemia, unspecified: Secondary | ICD-10-CM | POA: Insufficient documentation

## 2015-06-29 DIAGNOSIS — J8410 Pulmonary fibrosis, unspecified: Secondary | ICD-10-CM | POA: Diagnosis not present

## 2015-06-29 HISTORY — DX: Pulmonary fibrosis, unspecified: J84.10

## 2015-06-29 HISTORY — DX: Thyrotoxicosis, unspecified without thyrotoxic crisis or storm: E05.90

## 2015-06-29 HISTORY — DX: Hyperlipidemia, unspecified: E78.5

## 2015-06-29 HISTORY — DX: Anemia, unspecified: D64.9

## 2015-06-29 HISTORY — DX: Essential (primary) hypertension: I10

## 2015-06-29 LAB — PROTIME-INR
INR: 1.44
PROTHROMBIN TIME: 17.7 s — AB (ref 11.4–15.0)

## 2015-06-29 SURGERY — KYPHOPLASTY
Anesthesia: Choice | Site: Spine Lumbar | Wound class: Clean

## 2015-06-29 MED ORDER — BUPIVACAINE-EPINEPHRINE (PF) 0.5% -1:200000 IJ SOLN
INTRAMUSCULAR | Status: AC
  Filled 2015-06-29: qty 30

## 2015-06-29 MED ORDER — IOHEXOL 180 MG/ML  SOLN
INTRAMUSCULAR | Status: AC
  Filled 2015-06-29: qty 40

## 2015-06-29 MED ORDER — FENTANYL CITRATE (PF) 100 MCG/2ML IJ SOLN
INTRAMUSCULAR | Status: AC
  Filled 2015-06-29: qty 2

## 2015-06-29 MED ORDER — CEFAZOLIN SODIUM 1-5 GM-% IV SOLN: 1.0000 g | INTRAVENOUS | Status: DC

## 2015-06-29 MED ORDER — LIDOCAINE HCL (PF) 1 % IJ SOLN
INTRAMUSCULAR | Status: AC
  Filled 2015-06-29: qty 60

## 2015-06-29 MED ORDER — CEFAZOLIN SODIUM 1-5 GM-% IV SOLN
INTRAVENOUS | Status: AC
  Filled 2015-06-29: qty 50

## 2015-06-29 MED ORDER — CEFAZOLIN (ANCEF) 1 G IV SOLR: 1.0000 g | INTRAVENOUS | Status: DC

## 2015-06-29 MED ORDER — LACTATED RINGERS IV SOLN: INTRAVENOUS | Status: DC

## 2015-06-29 SURGICAL SUPPLY — 12 items
DEVICE BIOPSY BONE KYPHX (INSTRUMENTS) IMPLANT
DRAPE C-ARM XRAY 36X54 (DRAPES) IMPLANT
DURAPREP 26ML APPLICATOR (WOUND CARE) IMPLANT
GLOVE SURG ORTHO 9.0 STRL STRW (GLOVE) IMPLANT
GOWN SPECIALTY ULTRA XL (MISCELLANEOUS) IMPLANT
GOWN STRL REUS W/ TWL LRG LVL3 (GOWN DISPOSABLE) IMPLANT
GOWN STRL REUS W/TWL LRG LVL3 (GOWN DISPOSABLE)
LIQUID BAND (GAUZE/BANDAGES/DRESSINGS) IMPLANT
PACK KYPHOPLASTY (MISCELLANEOUS) IMPLANT
STRAP SAFETY BODY (MISCELLANEOUS) IMPLANT
TRAY KYPHOPAK 15/3 EXPRESS 1ST (MISCELLANEOUS) IMPLANT
TRAY KYPHOPAK 20/3 EXPRESS 1ST (MISCELLANEOUS) IMPLANT

## 2015-06-29 NOTE — H&P (Signed)
Reviewed paper H+P, will be scanned into chart. No changes noted.  

## 2015-06-29 NOTE — OR Nursing (Signed)
Case cancelled due to lab results

## 2015-06-29 NOTE — Anesthesia Preprocedure Evaluation (Deleted)
Anesthesia Evaluation  Patient identified by MRN, date of birth, ID band Patient awake    Reviewed: Allergy & Precautions, H&P , NPO status , Patient's Chart, lab work & pertinent test results, reviewed documented beta blocker date and time   History of Anesthesia Complications Negative for: history of anesthetic complications (has never had surgery)  Airway Mallampati: II  TM Distance: >3 FB Neck ROM: full    Dental no notable dental hx. (+) Poor Dentition, Missing   Pulmonary neg shortness of breath, neg sleep apnea, neg COPD, neg recent URI,  Pulmonary fibrosis   breath sounds clear to auscultation + decreased breath sounds      Cardiovascular Exercise Tolerance: Good hypertension, On Medications (-) angina(-) CAD, (-) Past MI, (-) Cardiac Stents and (-) CABG Normal cardiovascular exam+ dysrhythmias Atrial Fibrillation (-) Valvular Problems/Murmurs Rhythm:irregular Rate:Abnormal + Systolic murmurs    Neuro/Psych negative neurological ROS  negative psych ROS   GI/Hepatic negative GI ROS, Neg liver ROS,   Endo/Other  neg diabetesHyperthyroidism   Renal/GU negative Renal ROS  negative genitourinary   Musculoskeletal   Abdominal   Peds  Hematology negative hematology ROS (+)   Anesthesia Other Findings Past Medical History:   Atrial fibrillation (HCC)                                    Hypertension                                                 Hyperlipidemia                                               Hyperthyroidism                                              Anemia                                                       Pulmonary fibrosis (HCC)                                     Reproductive/Obstetrics negative OB ROS                             Anesthesia Physical Anesthesia Plan  ASA: III  Anesthesia Plan: General   Post-op Pain Management:    Induction:   Airway  Management Planned:   Additional Equipment:   Intra-op Plan:   Post-operative Plan:   Informed Consent: I have reviewed the patients History and Physical, chart, labs and discussed the procedure including the risks, benefits and alternatives for the proposed anesthesia with the patient or authorized representative who has indicated his/her understanding and acceptance.   Dental Advisory Given  Plan  Discussed with: Anesthesiologist, CRNA and Surgeon  Anesthesia Plan Comments:         Anesthesia Quick Evaluation

## 2015-06-29 NOTE — OR Nursing (Signed)
Labs reviewed byDr Karlton LemonKarenz  And Dr Rosita KeaMenz - case postponed until Thursday. Family in agreement.

## 2015-06-29 NOTE — OR Nursing (Signed)
Pt left unit via wheelchair accompanied by family in stable condition

## 2015-07-01 ENCOUNTER — Ambulatory Visit
Admission: RE | Admit: 2015-07-01 | Discharge: 2015-07-01 | Disposition: A | Discharge status: Home / Self Care | Payer: Medicare Other | Source: Ambulatory Visit | Attending: Orthopedic Surgery | Admitting: Orthopedic Surgery

## 2015-07-01 ENCOUNTER — Encounter
Admission: RE | Disposition: A | Discharge status: Home / Self Care | Payer: Self-pay | Source: Ambulatory Visit | Attending: Orthopedic Surgery

## 2015-07-01 ENCOUNTER — Ambulatory Visit: Payer: Medicare Other

## 2015-07-01 ENCOUNTER — Ambulatory Visit: Payer: Medicare Other | Admitting: Anesthesiology

## 2015-07-01 ENCOUNTER — Encounter: Payer: Self-pay | Admitting: Anesthesiology

## 2015-07-01 DIAGNOSIS — M4856XA Collapsed vertebra, not elsewhere classified, lumbar region, initial encounter for fracture: Secondary | ICD-10-CM | POA: Diagnosis present

## 2015-07-01 DIAGNOSIS — W19XXXA Unspecified fall, initial encounter: Secondary | ICD-10-CM | POA: Diagnosis not present

## 2015-07-01 DIAGNOSIS — Z806 Family history of leukemia: Secondary | ICD-10-CM | POA: Insufficient documentation

## 2015-07-01 DIAGNOSIS — Y939 Activity, unspecified: Secondary | ICD-10-CM | POA: Diagnosis not present

## 2015-07-01 DIAGNOSIS — D509 Iron deficiency anemia, unspecified: Secondary | ICD-10-CM | POA: Diagnosis not present

## 2015-07-01 DIAGNOSIS — Z79899 Other long term (current) drug therapy: Secondary | ICD-10-CM | POA: Diagnosis not present

## 2015-07-01 DIAGNOSIS — I517 Cardiomegaly: Secondary | ICD-10-CM | POA: Diagnosis not present

## 2015-07-01 DIAGNOSIS — J8410 Pulmonary fibrosis, unspecified: Secondary | ICD-10-CM | POA: Diagnosis not present

## 2015-07-01 DIAGNOSIS — E785 Hyperlipidemia, unspecified: Secondary | ICD-10-CM | POA: Insufficient documentation

## 2015-07-01 DIAGNOSIS — Z419 Encounter for procedure for purposes other than remedying health state, unspecified: Secondary | ICD-10-CM

## 2015-07-01 DIAGNOSIS — S32020A Wedge compression fracture of second lumbar vertebra, initial encounter for closed fracture: Secondary | ICD-10-CM | POA: Insufficient documentation

## 2015-07-01 DIAGNOSIS — Y92008 Other place in unspecified non-institutional (private) residence as the place of occurrence of the external cause: Secondary | ICD-10-CM | POA: Insufficient documentation

## 2015-07-01 DIAGNOSIS — I4891 Unspecified atrial fibrillation: Secondary | ICD-10-CM | POA: Insufficient documentation

## 2015-07-01 DIAGNOSIS — E0590 Thyrotoxicosis, unspecified without thyrotoxic crisis or storm: Secondary | ICD-10-CM | POA: Diagnosis not present

## 2015-07-01 DIAGNOSIS — Z8262 Family history of osteoporosis: Secondary | ICD-10-CM | POA: Diagnosis not present

## 2015-07-01 DIAGNOSIS — S32010A Wedge compression fracture of first lumbar vertebra, initial encounter for closed fracture: Secondary | ICD-10-CM | POA: Diagnosis not present

## 2015-07-01 DIAGNOSIS — Z7901 Long term (current) use of anticoagulants: Secondary | ICD-10-CM | POA: Diagnosis not present

## 2015-07-01 DIAGNOSIS — I10 Essential (primary) hypertension: Secondary | ICD-10-CM | POA: Insufficient documentation

## 2015-07-01 HISTORY — PX: KYPHOPLASTY: SHX5884

## 2015-07-01 LAB — PROTIME-INR
INR: 1
PROTHROMBIN TIME: 13.4 s (ref 11.4–15.0)

## 2015-07-01 SURGERY — KYPHOPLASTY
Anesthesia: General | Site: Back | Wound class: Clean

## 2015-07-01 MED ORDER — SODIUM CHLORIDE 0.9 % IJ SOLN
INTRAMUSCULAR | Status: AC
  Filled 2015-07-01: qty 100

## 2015-07-01 MED ORDER — PROPOFOL 500 MG/50ML IV EMUL
INTRAVENOUS | Status: DC | PRN
  Administered 2015-07-01: 50 ug/kg/min via INTRAVENOUS

## 2015-07-01 MED ORDER — CEFAZOLIN SODIUM 1-5 GM-% IV SOLN
1.0000 g | Freq: Once | INTRAVENOUS | Status: AC
  Administered 2015-07-01: 1 g via INTRAVENOUS

## 2015-07-01 MED ORDER — BUPIVACAINE-EPINEPHRINE (PF) 0.5% -1:200000 IJ SOLN
INTRAMUSCULAR | Status: DC | PRN
  Administered 2015-07-01: 10 mL via PERINEURAL

## 2015-07-01 MED ORDER — ONDANSETRON HCL 4 MG/2ML IJ SOLN: 4.0000 mg | Freq: Once | INTRAMUSCULAR | Status: DC | PRN

## 2015-07-01 MED ORDER — IOHEXOL 180 MG/ML  SOLN
INTRAMUSCULAR | Status: DC | PRN
Start: 2015-07-01 — End: 2015-07-01
  Administered 2015-07-01: 20 mL via INTRAVENOUS

## 2015-07-01 MED ORDER — LIDOCAINE HCL (PF) 1 % IJ SOLN
INTRAMUSCULAR | Status: AC
  Filled 2015-07-01: qty 30

## 2015-07-01 MED ORDER — FENTANYL CITRATE (PF) 100 MCG/2ML IJ SOLN
INTRAMUSCULAR | Status: DC | PRN
  Administered 2015-07-01 (×4): 25 ug via INTRAVENOUS

## 2015-07-01 MED ORDER — IOHEXOL 180 MG/ML  SOLN
INTRAMUSCULAR | Status: AC
  Filled 2015-07-01: qty 20

## 2015-07-01 MED ORDER — FENTANYL CITRATE (PF) 100 MCG/2ML IJ SOLN: 25.0000 ug | INTRAMUSCULAR | Status: DC | PRN

## 2015-07-01 MED ORDER — LACTATED RINGERS IV SOLN
INTRAVENOUS | Status: DC
Start: 2015-07-01 — End: 2015-07-01
  Administered 2015-07-01: 07:00:00 via INTRAVENOUS

## 2015-07-01 MED ORDER — LIDOCAINE HCL 1 % IJ SOLN
INTRAMUSCULAR | Status: DC | PRN
  Administered 2015-07-01: 20 mL

## 2015-07-01 MED ORDER — BUPIVACAINE-EPINEPHRINE (PF) 0.5% -1:200000 IJ SOLN
INTRAMUSCULAR | Status: AC
  Filled 2015-07-01: qty 30

## 2015-07-01 MED ORDER — CEFAZOLIN SODIUM 1-5 GM-% IV SOLN
INTRAVENOUS | Status: AC
  Administered 2015-07-01: 1 g via INTRAVENOUS
  Filled 2015-07-01: qty 50

## 2015-07-01 SURGICAL SUPPLY — 13 items
CEMENT KYPHON CX01A KIT/MIXER (Cement) ×3 IMPLANT
DEVICE BIOPSY BONE KYPHX (INSTRUMENTS) ×6 IMPLANT
DRAPE C-ARM XRAY 36X54 (DRAPES) ×3 IMPLANT
DURAPREP 26ML APPLICATOR (WOUND CARE) ×3 IMPLANT
GLOVE SURG ORTHO 9.0 STRL STRW (GLOVE) ×12 IMPLANT
GOWN SPECIALTY ULTRA XL (MISCELLANEOUS) ×3 IMPLANT
GOWN STRL REUS W/ TWL LRG LVL3 (GOWN DISPOSABLE) ×1 IMPLANT
GOWN STRL REUS W/TWL LRG LVL3 (GOWN DISPOSABLE) ×2
LIQUID BAND (GAUZE/BANDAGES/DRESSINGS) ×3 IMPLANT
PACK KYPHOPLASTY (MISCELLANEOUS) ×3 IMPLANT
STRAP SAFETY BODY (MISCELLANEOUS) IMPLANT
TRAY KYPHOPAK 15/3 EXPRESS 1ST (MISCELLANEOUS) IMPLANT
TRAY KYPHOPAK 20/3 EXPRESS 1ST (MISCELLANEOUS) ×6 IMPLANT

## 2015-07-01 NOTE — Anesthesia Postprocedure Evaluation (Signed)
  Anesthesia Post-op Note  Patient: Jillian Russell  Procedure(s) Performed: Procedure(s): KYPHOPLASTY (N/A)  Anesthesia type:General  Patient location: PACU  Post pain: Pain level controlled  Post assessment: Post-op Vital signs reviewed, Patient's Cardiovascular Status Stable, Respiratory Function Stable, Patent Airway and No signs of Nausea or vomiting  Post vital signs: Reviewed and stable  Last Vitals:  Filed Vitals:   07/01/15 1006  BP: 122/85  Pulse: 96  Temp:   Resp: 16    Level of consciousness: awake, alert  and patient cooperative  Complications: No apparent anesthesia complications

## 2015-07-01 NOTE — Discharge Instructions (Addendum)
Remove Band-Aid's on Saturday okay to shower after that. Starting tomorrow activities as tolerated. Resume warfarin tonight.AMBULATORY SURGERY  DISCHARGE INSTRUCTIONS   1) The drugs that you were given will stay in your system until tomorrow so for the next 24 hours you should not:  A) Drive an automobile B) Make any legal decisions C) Drink any alcoholic beverage   2) You may resume regular meals tomorrow.  Today it is better to start with liquids and gradually work up to solid foods.  You may eat anything you prefer, but it is better to start with liquids, then soup and crackers, and gradually work up to solid foods.   3) Please notify your doctor immediately if you have any unusual bleeding, trouble breathing, redness and pain at the surgery site, drainage, fever, or pain not relieved by medication.    4) Additional Instructions:    Please contact your physician with any problems or Same Day Surgery at [phone], Monday through Friday 6 am to 4 pm, or [place] at [facility] Main number at [phone].

## 2015-07-01 NOTE — Anesthesia Preprocedure Evaluation (Signed)
Anesthesia Evaluation  Patient identified by MRN, date of birth, ID band Patient awake    Reviewed: Allergy & Precautions, H&P , NPO status , Patient's Chart, lab work & pertinent test results, reviewed documented beta blocker date and time   History of Anesthesia Complications Negative for: history of anesthetic complications (has never had surgery)  Airway Mallampati: II  TM Distance: >3 FB Neck ROM: full    Dental no notable dental hx. (+) Poor Dentition, Missing   Pulmonary neg shortness of breath, neg sleep apnea, neg COPD, neg recent URI,  Pulmonary fibrosis   breath sounds clear to auscultation + decreased breath sounds      Cardiovascular Exercise Tolerance: Good hypertension, On Medications (-) angina(-) CAD, (-) Past MI, (-) Cardiac Stents and (-) CABG Normal cardiovascular exam+ dysrhythmias Atrial Fibrillation (-) Valvular Problems/Murmurs Rhythm:irregular Rate:Abnormal + Systolic murmurs    Neuro/Psych negative neurological ROS  negative psych ROS   GI/Hepatic negative GI ROS, Neg liver ROS,   Endo/Other  neg diabetesHyperthyroidism   Renal/GU negative Renal ROS  negative genitourinary   Musculoskeletal   Abdominal   Peds  Hematology negative hematology ROS (+)   Anesthesia Other Findings Past Medical History:   Atrial fibrillation (HCC)                                    Hypertension                                                 Hyperlipidemia                                               Hyperthyroidism                                              Anemia                                                       Pulmonary fibrosis (HCC)                                     Reproductive/Obstetrics negative OB ROS                             Anesthesia Physical Anesthesia Plan  ASA: III  Anesthesia Plan: General   Post-op Pain Management:    Induction:   Airway  Management Planned:   Additional Equipment:   Intra-op Plan:   Post-operative Plan:   Informed Consent: I have reviewed the patients History and Physical, chart, labs and discussed the procedure including the risks, benefits and alternatives for the proposed anesthesia with the patient or authorized representative who has indicated his/her understanding and acceptance.   Dental Advisory Given  Plan   Plan Discussed with: Anesthesiologist, CRNA and Surgeon  Anesthesia Plan Comments:         Anesthesia Quick Evaluation

## 2015-07-01 NOTE — Transfer of Care (Signed)
Immediate Anesthesia Transfer of Care Note  Patient: Jillian HeirMargie M Rarick  Procedure(s) Performed: Procedure(s): KYPHOPLASTY (N/A)  Patient Location: PACU  Anesthesia Type:General  Level of Consciousness: awake, alert  and oriented  Airway & Oxygen Therapy: Patient Spontanous Breathing  Post-op Assessment: Report given to RN and Post -op Vital signs reviewed and stable  Post vital signs: Reviewed and stable  Last Vitals:  Filed Vitals:   07/01/15 0840  BP:   Pulse: 95  Temp:   Resp: 29    Complications: No apparent anesthesia complications

## 2015-07-01 NOTE — H&P (Signed)
Reviewed paper H+P, will be scanned into chart. No changes noted. Awaiting protime, was too high two days ago.

## 2015-07-01 NOTE — Op Note (Signed)
07/01/2015  8:35 AM  PATIENT:  Jillian Russell  81 y.o. female  PRE-OPERATIVE DIAGNOSIS:  L1 AND L2 COMPRESSION FRACTURE  POST-OPERATIVE DIAGNOSIS:  L1 AND L2 COMPRESSION FRACTURE  PROCEDURE:  Procedure(s): KYPHOPLASTY (N/A)  SURGEON: Leitha SchullerMichael J Cheyrl Buley, MD  ASSISTANTS: None  ANESTHESIA:   local and MAC  EBL:    minimal  BLOOD ADMINISTERED:none  DRAINS: none   LOCAL MEDICATIONS USED:  MARCAINE    and XYLOCAINE   SPECIMEN:  Source of Specimen:  L2 vertebral body  DISPOSITION OF SPECIMEN:  PATHOLOGY  COUNTS:  YES  TOURNIQUET:  * No tourniquets in log *  IMPLANTS: Bone cement  DICTATION: .Dragon Dictation patient was brought to the operating room, and after adequate sedation was given the patient was placed prone. C-arm was brought in and good visualization of L1 and L2 were obtained. After appropriate patient identification and timeout procedure the skin was prepped with alcohol and 5 cc 1% Xylocaine was infiltrated subcutaneously for initial local. The back was then prepped and draped in sterile manner and repeat timeout procedure carried out. Spinal needle was used to get to the pedicle on the right at L1 on the left at L2 with 10 cc of 1% Xylocaine 10 cc half percent Sensorcaine with epinephrine infiltrated  from the bone through the entire tract. Next small incision was made on the right at L1 and a trocar advanced in an extra pedicular fashion entering the vertebral body and by biopsy could not be obtained this level and drilling was carried out followed by placement of a balloon inflation and approximate 3 cc and L1. Next the identical procedures carried out on the left side at L2. Proximally 2 and half cc inflation at L2. Next the bone cement was mixed and 3 have cc infiltrated on the L1 3 cc and L2 with good interdigitation and passing across the midline into both sides. After the cement was set trochars removed and permanent C-arm views obtained. Dermabond was used to close  the skin followed by Band-Aids  PLAN OF CARE: Discharge to home after PACU  PATIENT DISPOSITION:  PACU - hemodynamically stable.

## 2015-07-02 LAB — SURGICAL PATHOLOGY

## 2017-03-27 IMAGING — CT CT ABD-PELV W/ CM
1 of 3 series · 13 of 32 positions shown, 18 images · IV contrast (omnipaque)
Comparison: 10/03/2013

CLINICAL DATA: Right and left lower quadrant pain starting years
ago. Examination suggest small bowel obstruction.

EXAM:
CT ABDOMEN AND PELVIS WITH CONTRAST
TECHNIQUE: Multidetector CT imaging of the abdomen and pelvis was performed
using the standard protocol following bolus administration of
intravenous contrast.
CONTRAST:  100mL OMNIPAQUE IOHEXOL 300 MG/ML  SOLN

[Series 2: routine abd pel with · axial · 0.70mm/px · z∈[-342,+18]mm · 13 of 82 slices shown, 18 images]
[im 5/82  soft-tissue]
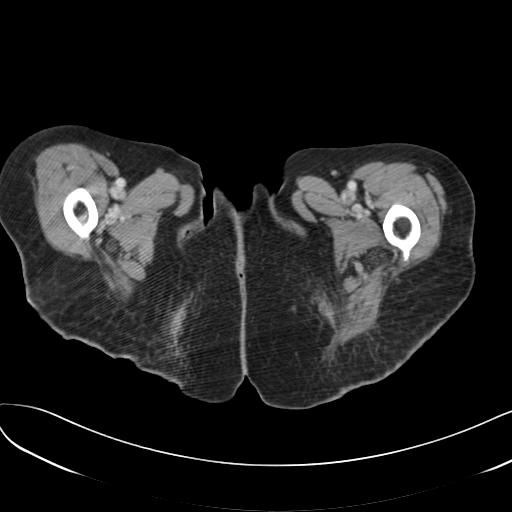
[im 5/82  bone]
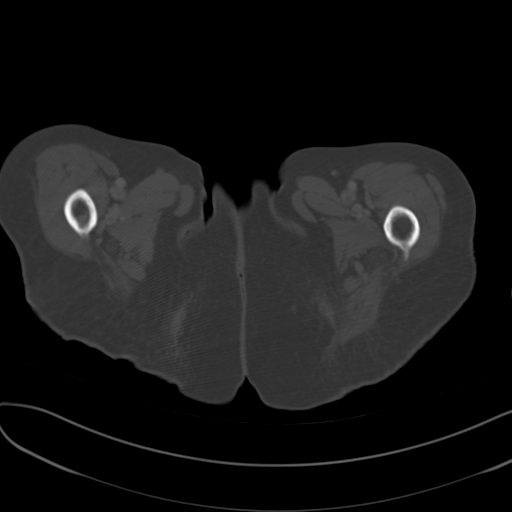
[im 13/82  soft-tissue]
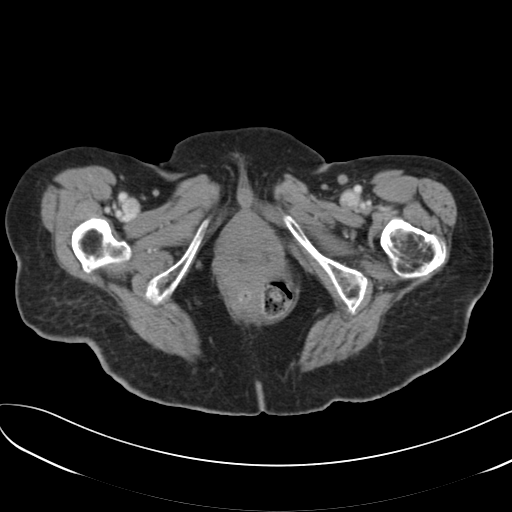
[im 18/82  soft-tissue]
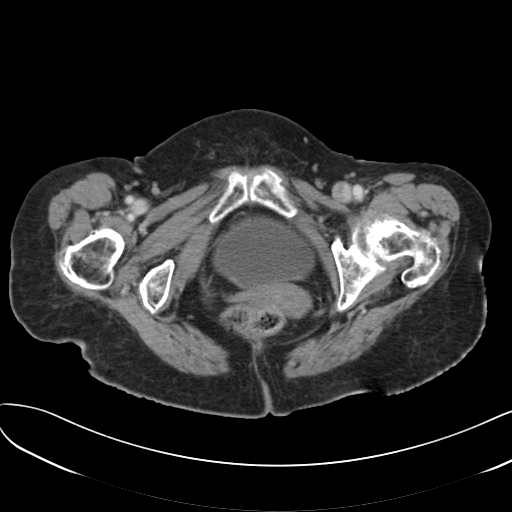
[im 26/82  soft-tissue]
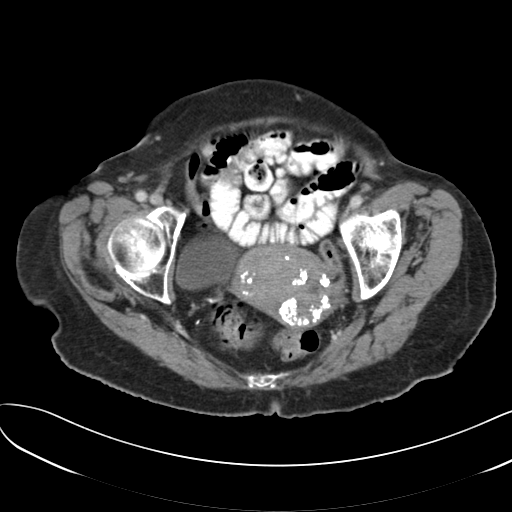
[im 30/82  soft-tissue]
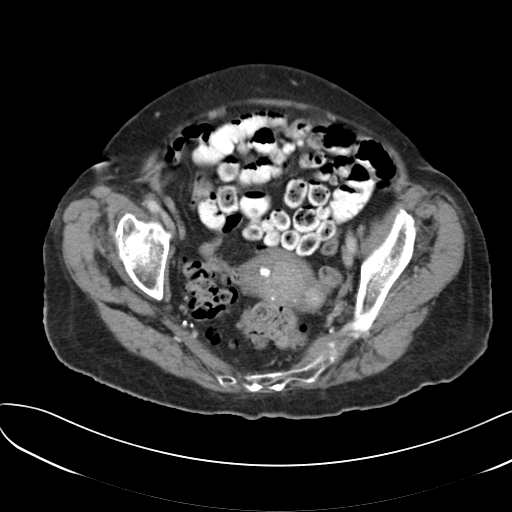
[im 39/82  soft-tissue]
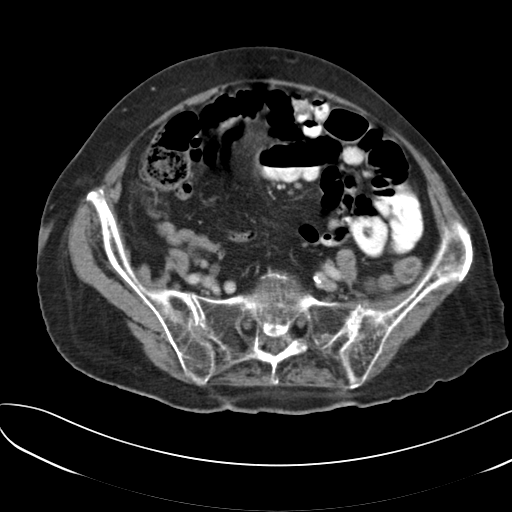
[im 43/82  soft-tissue]
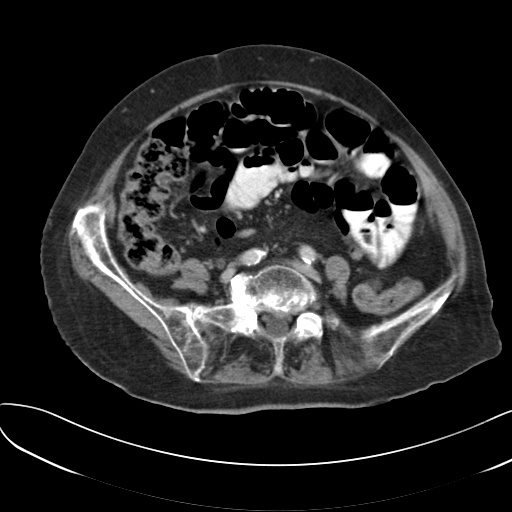
[im 52/82  soft-tissue]
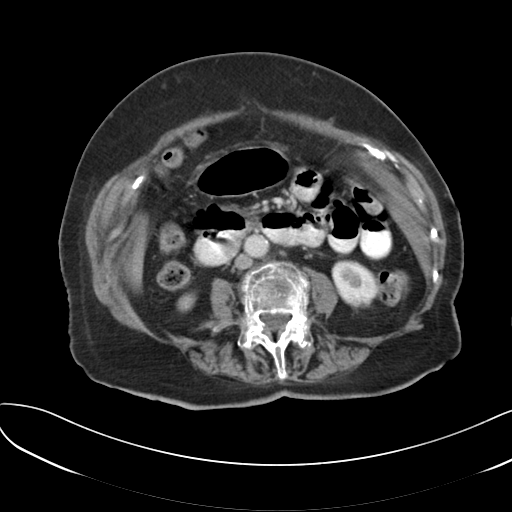
[im 56/82  soft-tissue]
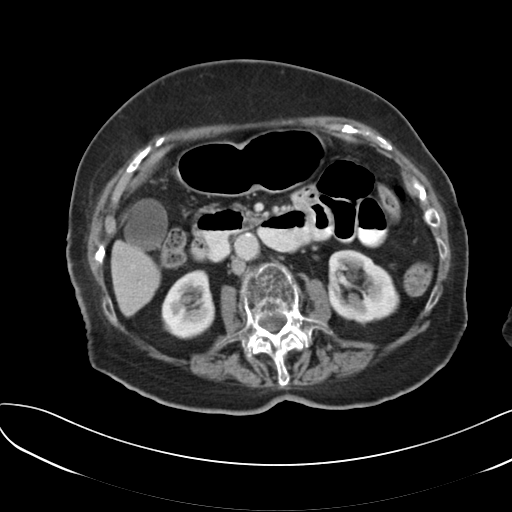
[im 56/82  bone]
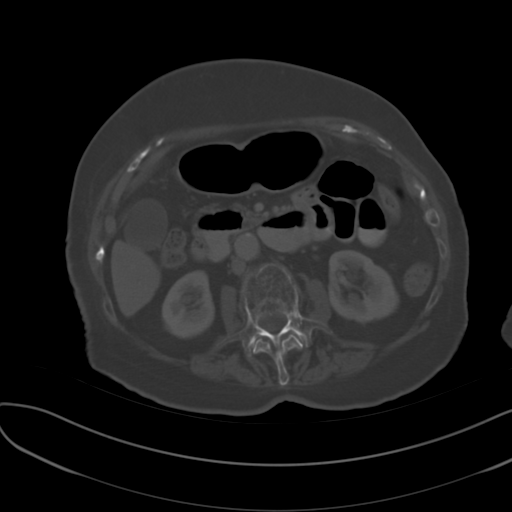
[im 64/82  soft-tissue]
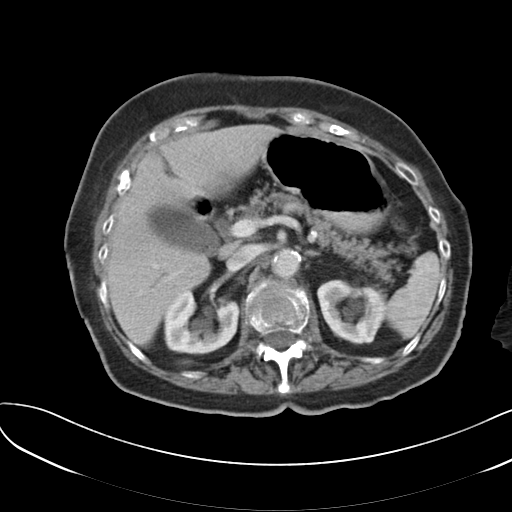
[im 64/82  lung]
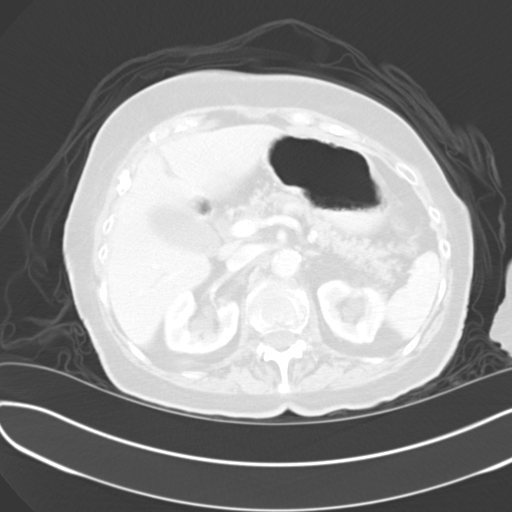
[im 69/82  soft-tissue]
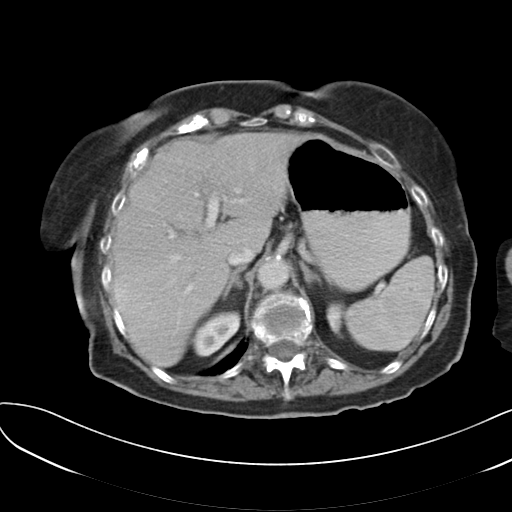
[im 69/82  lung]
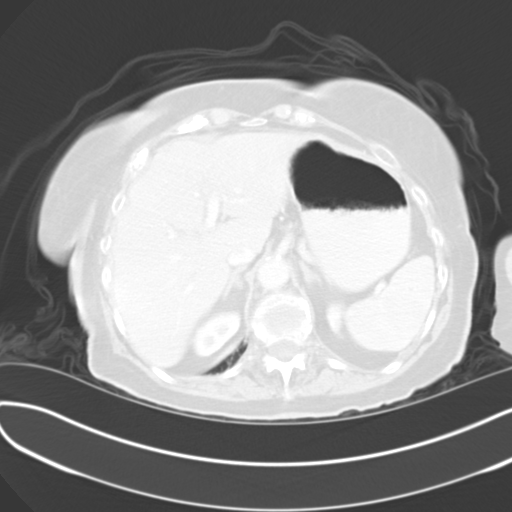
[im 73/82  lung]
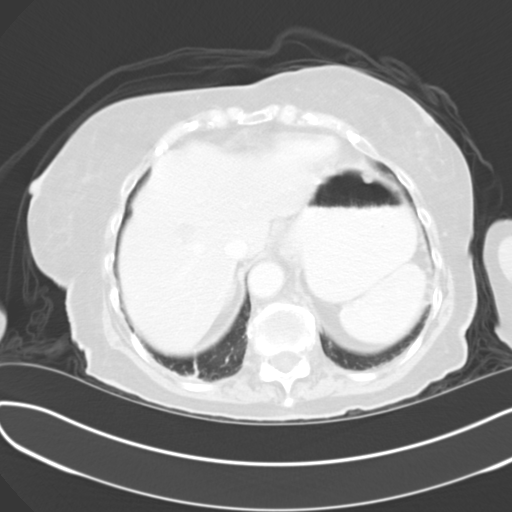
[im 77/82  soft-tissue]
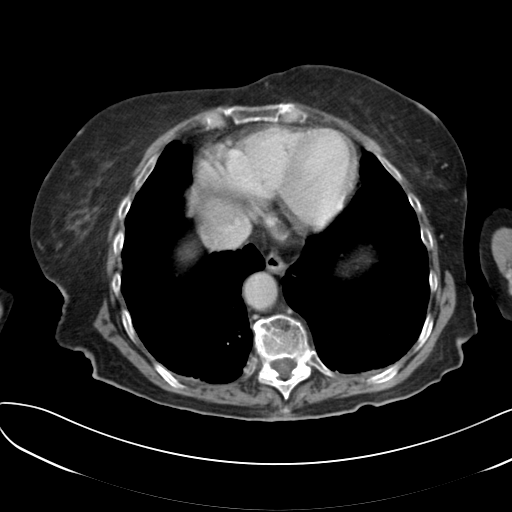
[im 77/82  lung]
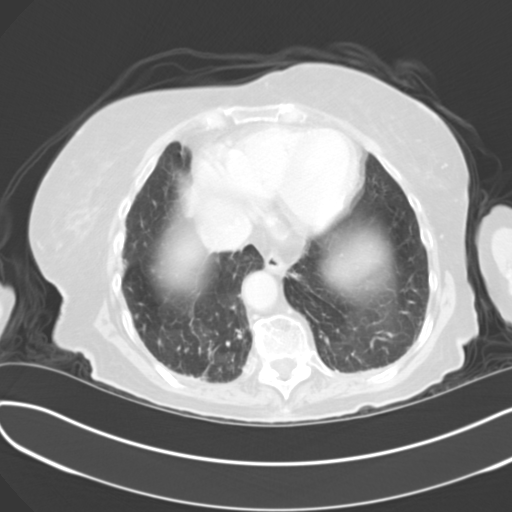

[13 of 32 positions shown; findings below may reference images not displayed]

FINDINGS: Visualization of lung bases is limited due to motion artifact. There
is evidence of emphysema and fibrosis in the lung bases with tiny
punctate nodular changes possibly representing interstitial nodules
versus changes of bronchiolitis. Appearance is similar to prior
study.

Low-attenuation lesions centrally in the liver measuring 12 mm
diameter. No change since prior study. The gallbladder, adrenal
glands, inferior vena cava, and retroperitoneal lymph nodes are
unremarkable. There is diffuse fatty infiltration of the pancreas.
Calcified granulomas in the spleen. Parapelvic cysts in both
kidneys. No hydronephrosis or solid renal mass identified.
Calcification and torsion of the aorta without aneurysm. Stomach,
small bowel, and colon are not abnormally distended. Small bowel
diameter at the upper limits of normal. Duodenum diverticulum. Fecal
appearing material is present in the distal small bowel and there
are multiple small bowel diverticula. This may indicate a stasis
pattern. Stool-filled colon without abnormal distention or wall
thickening. No free air or free fluid in the abdomen.

Pelvis: Multiple calcified fibroids in the uterus. No abnormal
adnexal masses. Bladder wall is not thickened. Appendix is normal.
Diverticulosis of the sigmoid colon. No evidence of
diverticulitis.No free or loculated pelvic fluid collections.
Diffuse bone demineralization. Multiple compression fractures
throughout the lumbar spine and at T12. The T12 fracture is new
since the previous study. This is likely due to osteoporosis. Sacrum
and pelvis appear intact.
IMPRESSION: No findings to suggest small bowel obstruction. There is fecal
appearing material within the small bowel and multiple small bowel
diverticula are present which may suggest stasis pattern.
Diverticulosis of the sigmoid colon without diverticulitis.
Calcified uterine fibroids. Parapelvic cysts in the kidneys. Diffuse
bone demineralization with multiple spinal compression fractures
most likely represent osteoporosis.

## 2017-12-21 ENCOUNTER — Emergency Department: Payer: Medicare Other

## 2017-12-21 ENCOUNTER — Encounter: Payer: Self-pay | Admitting: Emergency Medicine

## 2017-12-21 ENCOUNTER — Emergency Department
Admission: EM | Admit: 2017-12-21 | Discharge: 2017-12-21 | Disposition: A | Discharge status: Home / Self Care | Payer: Medicare Other | Attending: Emergency Medicine | Admitting: Emergency Medicine

## 2017-12-21 DIAGNOSIS — I10 Essential (primary) hypertension: Secondary | ICD-10-CM | POA: Insufficient documentation

## 2017-12-21 DIAGNOSIS — S61214A Laceration without foreign body of right ring finger without damage to nail, initial encounter: Secondary | ICD-10-CM | POA: Diagnosis not present

## 2017-12-21 DIAGNOSIS — Z8679 Personal history of other diseases of the circulatory system: Secondary | ICD-10-CM | POA: Diagnosis not present

## 2017-12-21 DIAGNOSIS — Y92129 Unspecified place in nursing home as the place of occurrence of the external cause: Secondary | ICD-10-CM | POA: Insufficient documentation

## 2017-12-21 DIAGNOSIS — Y998 Other external cause status: Secondary | ICD-10-CM | POA: Diagnosis not present

## 2017-12-21 DIAGNOSIS — Y9389 Activity, other specified: Secondary | ICD-10-CM | POA: Insufficient documentation

## 2017-12-21 DIAGNOSIS — W228XXA Striking against or struck by other objects, initial encounter: Secondary | ICD-10-CM | POA: Insufficient documentation

## 2017-12-21 DIAGNOSIS — Z79899 Other long term (current) drug therapy: Secondary | ICD-10-CM | POA: Diagnosis not present

## 2017-12-21 DIAGNOSIS — Z7901 Long term (current) use of anticoagulants: Secondary | ICD-10-CM | POA: Insufficient documentation

## 2017-12-21 LAB — GLUCOSE, CAPILLARY: Glucose-Capillary: 74 mg/dL (ref 65–99)

## 2017-12-21 MED ORDER — LIDOCAINE HCL (PF) 1 % IJ SOLN
10.0000 mL | Freq: Once | INTRAMUSCULAR | Status: AC
  Administered 2017-12-21: 10 mL via INTRADERMAL

## 2017-12-21 MED ORDER — LIDOCAINE HCL (PF) 1 % IJ SOLN
INTRAMUSCULAR | Status: AC
  Administered 2017-12-21: 10 mL via INTRADERMAL
  Filled 2017-12-21: qty 10

## 2017-12-21 MED ORDER — CEPHALEXIN 250 MG PO CAPS: 250.0000 mg | ORAL_CAPSULE | Freq: Four times a day (QID) | ORAL | 0 refills | Status: AC

## 2017-12-21 MED ORDER — CEPHALEXIN 250 MG PO CAPS
250.0000 mg | ORAL_CAPSULE | Freq: Once | ORAL | Status: AC
  Administered 2017-12-21: 250 mg via ORAL
  Filled 2017-12-21: qty 1

## 2017-12-21 NOTE — ED Triage Notes (Signed)
[facility] c/o laceration to the ring finger on the right hand.  Patient was shifting in her wheelchair and somehow caught her finger on part of the wheel chair.  Patient has laceration bandaged at this time and all bleeding is under control.  Patient has h/o dementia.

## 2017-12-21 NOTE — ED Provider Notes (Signed)
[facility] Emergency Department Provider Note   ____________________________________________   First MD Initiated Contact with Patient 12/21/17 1123     (approximate)  I have reviewed the triage vital signs and the nursing notes.   HISTORY  Chief Complaint Laceration  EM caveat: Limited due to patient dementia and ability to recall  HPI Jillian Russell is a 84 y.o. female here for evaluation after injuring her hand getting into wheelchair.  EMS and the patient's son gives similar history that the patient was being transferred from her bed to the wheelchair when her hand got caught, her right hand in the wheelchair.  She suffered a laceration to her right hand.  She had no head injury.  She did not fall.  She had a small skin tear to the forearm, but most the injury include a laceration to the middle of her right ring finger.  She is otherwise been in her normal health.  No other concerns noted by son.  Patient reports pain in her right hand.  She denies other injury.  Somewhat hard to assess if there is any numbness, but the patient does report she is able to feel her right hand including the right index finger.  She denies any numbness.  She is able to move the hand but reports it hurts in the finger of the right hand.  Patient's son reports had a last tetanus shot within the last 1-2 years.   Past Medical History:  Diagnosis Date  . Anemia   . Atrial fibrillation (HCC)   . Hyperlipidemia   . Hypertension   . Hyperthyroidism   . Pulmonary fibrosis (HCC)     There are no active problems to display for this patient.   Past Surgical History:  Procedure Laterality Date  . KYPHOPLASTY N/A 07/01/2015   Procedure: KYPHOPLASTY;  Surgeon: Kennedy Bucker, MD;  Location: [facility] ORS;  Service: Orthopedics;  Laterality: N/A;  . NO PAST SURGERIES      Prior to Admission medications   Medication Sig Start Date End Date Taking? Authorizing Provider    acetaminophen (TYLENOL) 325 MG tablet Take 650 mg by mouth every 4 (four) hours as needed.    [provider]  cephALEXin (KEFLEX) 250 MG capsule Take 1 capsule (250 mg total) by mouth 4 (four) times daily for 10 days. 12/21/17 12/31/17  Sharyn Creamer, MD  diltiazem (CARDIZEM CD) 120 MG 24 hr capsule Take 120 mg by mouth at bedtime.     [provider]  nitrofurantoin, macrocrystal-monohydrate, (MACROBID) 100 MG capsule Take 100 mg by mouth 2 (two) times daily.    [provider]  oxyCODONE-acetaminophen (ROXICET) 5-325 MG per tablet Take 1 tablet by mouth every 6 (six) hours as needed for severe pain. 05/20/15   Sharman Cheek, MD  warfarin (COUMADIN) 3 MG tablet Take 3 mg by mouth at bedtime.    [provider]    Allergies Patient has no known allergies.  No family history on file.  Social History Social History   Tobacco Use  . Smoking status: Never Smoker  . Smokeless tobacco: Never Used  Substance Use Topics  . Alcohol use: No  . Drug use: No    Review of Systems  Review of systems: EM caveat Patient denies headache.  No chest pain.  No pain in her hips.  Denies other pain except for right hand where she reports pain.   ____________________________________________   PHYSICAL EXAM:  VITAL SIGNS: ED Triage Vitals [  12/21/17 1114]  Enc Vitals Group     BP      Pulse      Resp      Temp      Temp src      SpO2      Weight 106 lb (48.1 kg)     Height 5\' 1"  (1.549 m)     Head Circumference      Peak Flow      Pain Score      Pain Loc      Pain Edu?      Excl. in GC?     Constitutional: Alert and oriented to self and to her son. Well appearing and in no acute distress except notable pain that she reports in her right hand. Eyes: Conjunctivae are normal. Head: Atraumatic. Nose: No congestion/rhinnorhea. Mouth/Throat: Mucous membranes are moist. Neck: No stridor.   Cardiovascular: Normal rate, irregular rhythm. Grossly normal  heart sounds.  Good peripheral circulation. Respiratory: Normal respiratory effort.  No retractions. Lungs CTAB. Gastrointestinal: Soft and nontender. No distention. Musculoskeletal:   RIGHT Right upper extremity demonstrates normal strength, good use of all muscles. No edema bruising or contusions of the right shoulder/upper arm, right elbow, right forearm / hand. Full range of motion of the right right upper extremity without pain except in the right hand.   Strong radial pulse. Intact median/ulnar/radial neuro-muscular exam and capillary refill is normal in all digits including the index finger with laceration.  The patient has a partial avulsion of the soft tissues involving the right volar surface of the index finger from approximately the PIP extending to the level of the MCP.  Does not have any bony exposure, no tendons or ligaments are denoted to be involved.  No foreign bodies.  Pupils patient is able to open and close the hand but reports pain limiting bending of the right finger, but she is able to bend the tip of the right finger in particular flex it and extended normally.  She denies numbness in the finger, but reports it hurts a lot.  There is no nailbed injury.  There is also a small skin tear over the right forearm which is been previously bandaged and cared for by nursing home staff.  LEFT Left upper extremity demonstrates normal strength, good use of all muscles. No edema bruising or contusions of the left shoulder/upper arm, left elbow, left forearm / hand. Full range of motion of the left  upper extremity without pain. No evidence of trauma. Strong radial pulse. Intact median/ulnar/radial neuro-muscular exam.  She moves both lower extremities well without evidence of injury.  Denies pain in the hips.  No pain with axial loading.  No deformities noted.   Neurologic:  Normal speech and language. No gross focal neurologic deficits are appreciated.  Skin:  Skin is warm, dry and  intact. No rash noted. Psychiatric: Mood and affect are normal. Speech and behavior are slightly anxious, somewhat pleasantly confused.  Son reports that her baseline.  ____________________________________________   LABS (all labs ordered are listed, but only abnormal results are displayed)  Labs Reviewed  GLUCOSE, CAPILLARY  CBG MONITORING, ED   ____________________________________________  EKG   ____________________________________________  RADIOLOGY    Right hand negative for acute reviewed by me, x-ray ____________________________________________   PROCEDURES  Procedure(s) performed: laceration, see procedure note(s).  Marland Kitchen.Laceration Repair Date/Time: 12/21/2017 12:55 PM Performed by: Sharyn Creamer, MD Authorized by: Sharyn Creamer, MD   Consent:    Consent obtained:  Verbal  Consent given by:  Guardian (son)   Risks discussed:  Infection, retained foreign body, poor cosmetic result, poor wound healing, tendon damage and vascular damage Anesthesia (see MAR for exact dosages):    Anesthesia method:  Nerve block   Block location:  Right 4th digit hand   Block needle gauge:  24 G   Block anesthetic:  Lidocaine 1% w/o epi   Block injection procedure:  Anatomic landmarks identified, introduced needle, incremental injection and negative aspiration for blood   Block outcome:  Anesthesia achieved (perfect anesthesia, painfree after) Laceration details:    Location: right index volar, hand.   Wound length (cm): 5.   Laceration depth: 4. Repair type:    Repair type:  Simple Pre-procedure details:    Preparation:  Patient was prepped and draped in usual sterile fashion and imaging obtained to evaluate for foreign bodies Exploration:    Hemostasis achieved with:  Direct pressure   Wound exploration: wound explored through full range of motion and entire depth of wound probed and visualized     Wound extent: areolar tissue violated and vascular damage     Wound extent: no  fascia violation noted, no foreign bodies/material noted, no muscle damage noted, no nerve damage noted, no tendon damage noted and no underlying fracture noted     Wound extent comment:  Some dusky color of the laceration flap but strong cap refill of finger and of the proximal region of very masserated flap   Contaminated: no   Treatment:    Area cleansed with:  Saline and Betadine   Amount of cleaning:  Extensive   Irrigation solution:  Sterile saline   Irrigation volume:  250   Irrigation method:  Syringe   Visualized foreign bodies/material removed: no   Skin repair:    Repair method:  Sutures   Suture size:  4-0   Suture technique:  Simple interrupted   Number of sutures:  3 Approximation:    Approximation:  Loose Post-procedure details:    Dressing:  Sterile dressing and non-adherent dressing   Patient tolerance of procedure:  Tolerated well, no immediate complications Comments:     Risks and wound care discussed with son including the risks that she may have poor vascularization and possible necrosis of the tissue flap.  Patient's son understands, will have it monitored closely by her primary care doctor for wound healing.  She will be able to see Dr. Quillian Quince at [facility] early this week for follow-up.    Critical Care performed: No  ____________________________________________   INITIAL IMPRESSION / ASSESSMENT AND PLAN / ED COURSE  Pertinent labs & imaging results that were available during my care of the patient were reviewed by me and considered in my medical decision making (see chart for details).  Patient presents for an injury after transferring to wheelchair.  There was no fall.  No signs of injury except to the right hand and a skin tear to right forearm which is been bandaged.  After performing digital block, the patient tolerated procedure very well.  Discussed risks including the possibility of poor healing with the son and possible loss of some of the  overlying tissue including the flap.  There is no deep injury to the muscles nerves or tendons denoted.  Normal vascular exam of the tip of the finger.  No associated neurologic cardiac or vascular symptoms.  No pulmonary symptoms.  Stable hemodynamics.  ----------------------------------------- 12:59 PM on 12/21/2017 -----------------------------------------  Patient resting comfortably.  Patient's uncomfortable  bringing her back to her care facility.  Return precautions and treatment recommendations and follow-up discussed with the patient who is agreeable with the plan.       ____________________________________________   FINAL CLINICAL IMPRESSION(S) / ED DIAGNOSES  Final diagnoses:  Laceration of right ring finger without foreign body without damage to nail, initial encounter      NEW MEDICATIONS STARTED DURING THIS VISIT:  New Prescriptions   CEPHALEXIN (KEFLEX) 250 MG CAPSULE    Take 1 capsule (250 mg total) by mouth 4 (four) times daily for 10 days.     Note:  This document was prepared using Dragon voice recognition software and may include unintentional dictation errors.     Sharyn CreamerQuale, Mark, MD 12/21/17 2029

## 2017-12-21 NOTE — ED Notes (Signed)
At bedside with MD to administer digital block and suture patient.  Patient tolerating well at this time and in NAD.

## 2017-12-21 NOTE — Discharge Instructions (Signed)
You have been seen in the Emergency Department (ED) today for a laceration (cut).  Please keep the cut clean but do not submerge it in the water.  It has been repaired with staples or sutures that will need to be removed in about 7 days. Please follow up with your doctor, an urgent care, or return to the ED for suture removal.     Please follow up with your doctor as soon as possible regarding today's emergent visit.   Return to the ED or call your doctor if you notice any signs of infection such as fever, increased pain, increased redness, pus, or other symptoms that concern you.

## 2017-12-21 NOTE — ED Notes (Signed)
Pressure dressing applied to fingers at this time.  Patient instructed on taking the outer dressing off tomorrow.

## 2018-07-12 DEATH — deceased
